# Patient Record
Sex: Male | Born: 2000 | Race: White | Hispanic: No | Marital: Single | Smoking: Current every day smoker
Health system: Southern US, Community
[De-identification: ages and names within clinical notes are randomized; demographics above are authoritative.]

## PROBLEM LIST (undated history)

## (undated) ENCOUNTER — Emergency Department: Admission: EM | Payer: Self-pay | Source: Home / Self Care

## (undated) HISTORY — PX: NO PAST SURGERIES: SHX2092

## (undated) HISTORY — PX: TONSILLECTOMY: SHX5217

---
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
## 2019-06-15 ENCOUNTER — Ambulatory Visit
Admission: EM | Admit: 2019-06-15 | Discharge: 2019-06-15 | Disposition: A | Discharge status: Home / Self Care | Payer: Self-pay | Attending: Family Medicine | Admitting: Family Medicine

## 2019-06-15 ENCOUNTER — Other Ambulatory Visit: Payer: Self-pay

## 2019-06-15 DIAGNOSIS — W57XXXA Bitten or stung by nonvenomous insect and other nonvenomous arthropods, initial encounter: Secondary | ICD-10-CM

## 2019-06-15 DIAGNOSIS — S60467A Insect bite (nonvenomous) of left little finger, initial encounter: Secondary | ICD-10-CM

## 2019-06-15 MED ORDER — TRIAMCINOLONE ACETONIDE 0.5 % EX OINT: 1.0000 "application " | TOPICAL_OINTMENT | Freq: Two times a day (BID) | CUTANEOUS | 0 refills | Status: DC

## 2019-06-15 NOTE — ED Triage Notes (Signed)
Patient complains of insect bite or sting to his left pinky finger. Patient states that he did not see anything but has noticed itching and swelling.

## 2019-06-15 NOTE — Discharge Instructions (Signed)
Medication as prescribed.  Take care  Dr. Remijio Holleran  

## 2019-06-15 NOTE — ED Provider Notes (Signed)
[facility]    CSN: [id number] Arrival date & time: 06/15/19  1323  History   Chief Complaint Chief Complaint  Patient presents with  . Insect Bite   HPI  18 year old male presents with the above complaint.  Patient reports that he believes that he suffered a bite on his left little finger last night.  States that the area is itchy and slightly swollen.  Mild redness.  He did not witness the insect or bite.  He has taken Benadryl and some topical agents old area without resolution.  He has pain.  No fever.  No chills.  No drainage.  No other associated symptoms.  No other complaints.  PMH, Surgical Hx, Family Hx, Social History reviewed and updated as below.  PMH: No significant PMH.  Past Surgical History:  Procedure Laterality Date  . NO PAST SURGERIES     Home Medications    Prior to Admission medications   Medication Sig Start Date End Date Taking? Authorizing Provider  loratadine-pseudoephedrine (CLARITIN-D 24 HOUR) 10-240 MG 24 hr tablet Take 1 tablet by mouth daily. 06/06/17   Frederich Cha, MD  triamcinolone ointment (KENALOG) 0.5 % Apply 1 application topically 2 (two) times daily. 06/15/19   Coral Spikes, DO    Family History Family History  Problem Relation Age of Onset  . Healthy Mother   . Healthy Father     Social History Social History   Tobacco Use  . Smoking status: Never Smoker  . Smokeless tobacco: Never Used  Substance Use Topics  . Alcohol use: No  . Drug use: No     Allergies   Patient has no known allergies.   Review of Systems Review of Systems  Constitutional: Negative.   Skin:       Bug bite.   Physical Exam Triage Vital Signs ED Triage Vitals  Enc Vitals Group     BP 06/15/19 1344 (!) 113/54     Pulse Rate 06/15/19 1344 65     Resp 06/15/19 1344 18     Temp 06/15/19 1344 98.4 F (36.9 C)     Temp Source 06/15/19 1344 Oral     SpO2 06/15/19 1344 100 %     Weight 06/15/19 1342 160 lb (72.6 kg)     Height  06/15/19 1342 5\' 9"  (1.753 m)     Head Circumference --      Peak Flow --      Pain Score 06/15/19 1342 0     Pain Loc --      Pain Edu? --      Excl. in [place]? --    Updated Vital Signs BP (!) 113/54 (BP Location: Left Arm)   Pulse 65   Temp 98.4 F (36.9 C) (Oral)   Resp 18   Ht 5\' 9"  (1.753 m)   Wt 72.6 kg   SpO2 100%   BMI 23.63 kg/m   Visual Acuity Right Eye Distance:   Left Eye Distance:   Bilateral Distance:    Right Eye Near:   Left Eye Near:    Bilateral Near:     Physical Exam Vitals signs and nursing note reviewed.  Constitutional:      General: He is not in acute distress.    Appearance: Normal appearance. He is normal weight. He is not ill-appearing.  HENT:     Head: Normocephalic and atraumatic.  Eyes:     General:        Right eye: No discharge.  Left eye: No discharge.     Conjunctiva/sclera: Conjunctivae normal.  Cardiovascular:     Rate and Rhythm: Normal rate and regular rhythm.     Heart sounds: No murmur.  Pulmonary:     Effort: Pulmonary effort is normal.     Breath sounds: Normal breath sounds. No wheezing, rhonchi or rales.  Musculoskeletal:     Left lower leg: Left lower leg edema:   Skin:    Comments: Left 5th digit - Mild swelling and erythema on the radial aspect below the PIP.  Neurological:     Mental Status: He is alert.  Psychiatric:        Mood and Affect: Mood normal.        Behavior: Behavior normal.    UC Treatments / Results  Labs (all labs ordered are listed, but only abnormal results are displayed) Labs Reviewed - No data to display  EKG   Radiology No results found.  Procedures Procedures (including critical care time)  Medications Ordered in UC Medications - No data to display  Initial Impression / Assessment and Plan / UC Course  I have reviewed the triage vital signs and the nursing notes.  Pertinent labs & imaging results that were available during my care of the patient were reviewed by me  and considered in my medical decision making (see chart for details).    18 year old male presents with suspected insect bite.  Appears to have evidence of a localized reaction. Triamcinolone as directed.  Final Clinical Impressions(s) / UC Diagnoses   Final diagnoses:  Insect bite of left little finger, initial encounter     Discharge Instructions     Medication as prescribed.  Take care  Dr. Adriana Simasook    ED Prescriptions    Medication Sig Dispense Auth. Provider   triamcinolone ointment (KENALOG) 0.5 % Apply 1 application topically 2 (two) times daily. 30 g Tommie Samsook, Cassidy Tabet G, DO     Controlled Substance Prescriptions [place] Controlled Substance Registry consulted? Not Applicable   Tommie SamsCook, Joley Utecht G, DO 06/15/19 1436

## 2019-07-10 ENCOUNTER — Other Ambulatory Visit: Payer: Self-pay

## 2019-07-10 ENCOUNTER — Ambulatory Visit (INDEPENDENT_AMBULATORY_CARE_PROVIDER_SITE_OTHER): Payer: Self-pay

## 2019-07-10 ENCOUNTER — Ambulatory Visit
Admission: EM | Admit: 2019-07-10 | Discharge: 2019-07-10 | Disposition: A | Discharge status: Home / Self Care | Payer: Self-pay | Attending: Family Medicine | Admitting: Family Medicine

## 2019-07-10 ENCOUNTER — Encounter: Payer: Self-pay | Admitting: Emergency Medicine

## 2019-07-10 DIAGNOSIS — J069 Acute upper respiratory infection, unspecified: Secondary | ICD-10-CM

## 2019-07-10 DIAGNOSIS — R062 Wheezing: Secondary | ICD-10-CM

## 2019-07-10 DIAGNOSIS — Z7189 Other specified counseling: Secondary | ICD-10-CM

## 2019-07-10 DIAGNOSIS — Z20828 Contact with and (suspected) exposure to other viral communicable diseases: Secondary | ICD-10-CM

## 2019-07-10 DIAGNOSIS — Z20822 Contact with and (suspected) exposure to covid-19: Secondary | ICD-10-CM

## 2019-07-10 LAB — RAPID STREP SCREEN (MED CTR MEBANE ONLY): Streptococcus, Group A Screen (Direct): NEGATIVE

## 2019-07-10 MED ORDER — ALBUTEROL SULFATE HFA 108 (90 BASE) MCG/ACT IN AERS: 1.0000 | INHALATION_SPRAY | Freq: Four times a day (QID) | RESPIRATORY_TRACT | 0 refills | Status: DC | PRN

## 2019-07-10 MED ORDER — PSEUDOEPH-BROMPHEN-DM 30-2-10 MG/5ML PO SYRP: 5.0000 mL | ORAL_SOLUTION | Freq: Three times a day (TID) | ORAL | 0 refills | Status: DC | PRN

## 2019-07-10 NOTE — Discharge Instructions (Addendum)
It was very nice seeing you today in clinic. Thank you for entrusting me with your care.   Rest and increase fluid intake as much as possible. Water is always best, as sugar and caffeine containing fluids can cause you to become dehydrated. Try to incorporate electrolyte enriched fluids, such as Gatorade or Pedialyte, into your daily fluid intake.  Please utilize the medications that we discussed. Your prescriptions have been called in to your pharmacy. Use medications (inhaler and cough syrup), in addition to Tylenol/Ibuprofen as needed for symptoms.  You were tested for SARS-CoV-2 (novel coronavirus) today. Results have been taking 24-48 hours to come back. Please self quarantine at home until negative test results have been received.   Make arrangements to follow up with your regular doctor in 1 week for re-evaluation if not improving. If your symptoms/condition worsens, please seek follow up care either here or in the ER. Please remember, our [place] providers are "right here with you" when you need [place].   Again, it was my pleasure to take care of you today. Thank you for choosing our clinic. I hope that you start to feel better quickly.   Honor Loh, MSN, APRN, FNP-C, CEN Advanced Practice Provider [facility]

## 2019-07-10 NOTE — ED Triage Notes (Signed)
Patient c/o sore throat, cough, HAs, and sore throat that started yesterday.  Patient has not taken his temperature.

## 2019-07-11 NOTE — ED Provider Notes (Signed)
[place], [place]   Name: Ivan Hicks DOB: Mar 06, 2001 MRN: [id number] CSN: [id number] PCP: Patient, No Pcp Per  Arrival date and time:  07/10/19 1730  Chief Complaint:  Cough, Sore Throat, and Generalized Body Aches   NOTE: Prior to seeing the patient today, I have reviewed the triage nursing documentation and vital signs. Clinical staff has updated patient's PMH/PSHx, current medication list, and drug allergies/intolerances to ensure comprehensive history available to assist in medical decision making.   History:   HPI: Ivan Hicks is a 18 y.o. male who presents today with complaints of cough, sore throat, and body aches that began with acute onset yesterday. Patient reports subjective fevers; has not measured his temperature. Cough has mainly been dry. He denies any associated shortness of breath, but advises that his chest feels "tight". Patient complains of pleuritic chest wall pain associated that is reproducible with deep inspiration and palpation. Patient states, "my whole body hurts. I have been taking Mucinex and Tylenol cold, but it is not helping". Patient denies nausea, vomiting, diarrhea, and abdominal pain. He is eating and drinking well. He has not experienced any alterations in his sense of taste or smell. Patient concerned about possible exposure to SARS-CoV-2 (novel coronavirus) at work. He has never been tested for the virus per his report.   History reviewed. No pertinent past medical history.  Past Surgical History:  Procedure Laterality Date  . NO PAST SURGERIES      Family History  Problem Relation Age of Onset  . Healthy Mother   . Healthy Father     Social History   Tobacco Use  . Smoking status: Never Smoker  . Smokeless tobacco: Never Used  Substance Use Topics  . Alcohol use: No  . Drug use: No    There are no active problems to display for this patient.   Home Medications:    No outpatient medications have been marked as taking for the  07/10/19 encounter [facility] Encounter).    Allergies:   Patient has no known allergies.  Review of Systems (ROS): Review of Systems  Constitutional: Positive for fever (subjective). Negative for fatigue.  HENT: Positive for congestion, postnasal drip, rhinorrhea and sore throat. Negative for ear pain, sinus pressure, sinus pain, sneezing and trouble swallowing.   Eyes: Negative for pain, discharge and redness.  Respiratory: Positive for cough and chest tightness. Negative for shortness of breath.   Cardiovascular: Positive for chest pain (pleuritic). Negative for palpitations.  Gastrointestinal: Negative for abdominal pain, diarrhea, nausea and vomiting.  Musculoskeletal: Positive for myalgias. Negative for arthralgias, back pain and neck pain.  Skin: Negative for color change, pallor and rash.  Neurological: Positive for headaches. Negative for dizziness, syncope and weakness.  Hematological: Negative for adenopathy.     Vital Signs: Today's Vitals   07/10/19 1750 07/10/19 1753 07/10/19 1843  BP:  125/75   Pulse:  77   Resp:  16   Temp:  98.3 F (36.8 C)   TempSrc:  Oral   SpO2:  100%   Weight: 145 lb (65.8 kg)    Height: 5\' 8"  (1.727 m)    PainSc: 4   0-No pain    Physical Exam: Physical Exam  Constitutional: He is oriented to person, place, and time and well-developed, well-nourished, and in no distress. No distress.  HENT:  Head: Normocephalic and atraumatic.  Right Ear: Tympanic membrane normal.  Left Ear: Tympanic membrane normal.  Nose: Mucosal edema and rhinorrhea present. No sinus tenderness.  Mouth/Throat: Mucous membranes are normal. Posterior oropharyngeal erythema (+) clear PND present. No posterior oropharyngeal edema.  Eyes: Pupils are equal, round, and reactive to light. Conjunctivae and EOM are normal.  Neck: Normal range of motion. Neck supple. No tracheal deviation present.  Cardiovascular: Normal rate, regular rhythm, normal heart sounds and intact  distal pulses. Exam reveals no gallop and no friction rub.  No murmur heard. Pulmonary/Chest: Effort normal. No accessory muscle usage. No respiratory distress. He has wheezes (scattered expiratory). He has rhonchi (scattered in upper lobes; clears with cough). He has no rales. He exhibits tenderness.  Increased pleuritic chest wall pain reproducible with deep inspiration  Abdominal: Soft. Bowel sounds are normal. He exhibits no distension. There is no abdominal tenderness.  Musculoskeletal: Normal range of motion.  Lymphadenopathy:    He has no cervical adenopathy.  Neurological: He is alert and oriented to person, place, and time. Gait normal.  Skin: Skin is warm and dry. No rash noted. He is not diaphoretic.  Psychiatric: Memory, affect and judgment normal. His mood appears anxious.  Nursing note and vitals reviewed.   Urgent Care Treatments / Results:   LABS: PLEASE NOTE: all labs that were ordered this encounter are listed, however only abnormal results are displayed. Labs Reviewed  RAPID STREP SCREEN ([facility] ONLY)  CULTURE, GROUP A STREP ([facility])  NOVEL CORONAVIRUS, NAA (HOSP ORDER, SEND-OUT TO REF LAB; TAT 18-24 HRS)    EKG: -None  RADIOLOGY: Dg Chest 2 View  Result Date: 07/10/2019 CLINICAL DATA:  Cough. Dyspnea. Fever. Pleuritic chest pain. Current smoker. EXAM: CHEST - 2 VIEW COMPARISON:  None. FINDINGS: Normal heart size. Normal mediastinal contour. No pneumothorax. No pleural effusion. Lungs appear clear, with no acute consolidative airspace disease and no pulmonary edema. IMPRESSION: No active cardiopulmonary disease. Electronically Signed   By: Delbert PhenixJason A Poff M.D.   On: 07/10/2019 19:02   PROCEDURES: Procedures  MEDICATIONS RECEIVED THIS VISIT: Medications - No data to display  PERTINENT CLINICAL COURSE NOTES/UPDATES:   Initial Impression / Assessment and Plan / Urgent Care Course:  Pertinent labs & imaging results that were available during my care of the  patient were personally reviewed by me and considered in my medical decision making (see lab/imaging section of note for values and interpretations).  Ivan Hicks is a 18 y.o. male who [facility] with complaints of Cough, Sore Throat, and Generalized Body Aches   Patient overall well appearing and in no acute distress today in clinic. Presenting symptoms (see HPI) and exam as documented above. He presents with symptoms associated with SARS-CoV-2 (novel coronavirus). Discussed typical symptom constellation. Reviewed potential for infection and need for testing. Patient amenable to being tested. SARS-CoV-2 swab collected by certified clinical staff. Discussed variable turn around times associated with testing, as sw[facility] being [facility], and have been taking between 2-5 days to come back. He was advised to self quarantine, per [facility] guidelines, until negative results received.   Rapid streptococcal throat swab (-); reflex culture sent. Suspect viral illness with cough. Discussed supportive care measures at home during acute phase of illness. Patient to rest as much as possible. He was encouraged to ensure adequate hydration (water and ORS) to prevent dehydration and electrolyte derangements. Patient may use APAP and/or IBU on an as needed basis for pain/fever. Will send in a prescription for an albuterol MDI to help with wheezing and sensation of chest tightness. Will also send a prescription for [place] cough  syrup to help with patient's cough.   Current clinical condition warrants patient being out of work in order to quarantine while waiting for testing results. He advises that he is off this weekend and does not RTW until Monday. He does not need a note for work. Patient advised that him being able to RTW is contingent on his SARS-CoV-2 test results being reviewed as negative. If results are not back by Monday morning, he will need to remain out of work;  verbalized understanding.   Discussed follow up with primary care physician in 1 week for re-evaluation. I have reviewed the follow up and strict return precautions for any new or worsening symptoms. Patient is aware of symptoms that would be deemed urgent/emergent, and would thus require further evaluation either here or in the emergency department. At the time of discharge, he verbalized understanding and consent with the discharge plan as it was reviewed with him. All questions were fielded by provider and/or clinic staff prior to patient discharge.    Final Clinical Impressions / Urgent Care Diagnoses:   Final diagnoses:  Viral URI with cough  Encounter for laboratory testing for COVID-19 virus  Advice given about COVID-19 virus infection    New Prescriptions:  [place] Controlled Substance Registry consulted? Not Applicable  Meds ordered this encounter  Medications  . brompheniramine-pseudoephedrine-DM 30-2-10 MG/5ML syrup    Sig: Take 5 mLs by mouth 3 (three) times daily as needed.    Dispense:  118 mL    Refill:  0  . albuterol (VENTOLIN HFA) 108 (90 Base) MCG/ACT inhaler    Sig: Inhale 1-2 puffs into the lungs every 6 (six) hours as needed for wheezing or shortness of breath.    Dispense:  18 g    Refill:  0    Recommended Follow up Care:  Patient encouraged to follow up with the following provider within the specified time frame, or sooner as dictated by the severity of his symptoms. As always, he was instructed that for any urgent/emergent care needs, he should seek care either here or in the emergency department for more immediate evaluation.  Follow-up Information    PCP In 1 week.   Why: General reassessment of symptoms if not improving        NOTE: This note was prepared using Scientist, clinical (histocompatibility and immunogenetics) along with smaller Lobbyist. Despite my best ability to proofread, there is the potential that transcriptional errors may still occur from this process, and are  completely unintentional.    [name], NP 07/11/19 2355

## 2019-07-12 LAB — NOVEL CORONAVIRUS, NAA (HOSP ORDER, SEND-OUT TO REF LAB; TAT 18-24 HRS): SARS-CoV-2, NAA: NOT DETECTED

## 2019-07-13 ENCOUNTER — Encounter (HOSPITAL_COMMUNITY): Payer: Self-pay

## 2019-07-13 LAB — CULTURE, GROUP A STREP (THRC)

## 2020-07-18 ENCOUNTER — Other Ambulatory Visit: Payer: Self-pay

## 2020-07-18 ENCOUNTER — Emergency Department
Admission: EM | Admit: 2020-07-18 | Discharge: 2020-07-21 | Disposition: A | Discharge status: Home / Self Care | Payer: Self-pay | Attending: Emergency Medicine | Admitting: Emergency Medicine

## 2020-07-18 DIAGNOSIS — F69 Unspecified disorder of adult personality and behavior: Secondary | ICD-10-CM | POA: Insufficient documentation

## 2020-07-18 DIAGNOSIS — R440 Auditory hallucinations: Secondary | ICD-10-CM | POA: Insufficient documentation

## 2020-07-18 DIAGNOSIS — F17210 Nicotine dependence, cigarettes, uncomplicated: Secondary | ICD-10-CM | POA: Insufficient documentation

## 2020-07-18 DIAGNOSIS — Z20822 Contact with and (suspected) exposure to covid-19: Secondary | ICD-10-CM | POA: Insufficient documentation

## 2020-07-18 LAB — URINE DRUG SCREEN, QUALITATIVE (ARMC ONLY)
Amphetamines, Ur Screen: NOT DETECTED
Barbiturates, Ur Screen: NOT DETECTED
Benzodiazepine, Ur Scrn: NOT DETECTED
Cannabinoid 50 Ng, Ur ~~LOC~~: NOT DETECTED
Cocaine Metabolite,Ur ~~LOC~~: NOT DETECTED
MDMA (Ecstasy)Ur Screen: NOT DETECTED
Methadone Scn, Ur: NOT DETECTED
Opiate, Ur Screen: NOT DETECTED
Phencyclidine (PCP) Ur S: NOT DETECTED
Tricyclic, Ur Screen: NOT DETECTED

## 2020-07-18 LAB — CBC
HCT: 43.5 % (ref 39.0–52.0)
Hemoglobin: 15.4 g/dL (ref 13.0–17.0)
MCH: 33.2 pg (ref 26.0–34.0)
MCHC: 35.4 g/dL (ref 30.0–36.0)
MCV: 93.8 fL (ref 80.0–100.0)
Platelets: 267 10*3/uL (ref 150–400)
RBC: 4.64 MIL/uL (ref 4.22–5.81)
RDW: 12.3 % (ref 11.5–15.5)
WBC: 10.1 10*3/uL (ref 4.0–10.5)
nRBC: 0 % (ref 0.0–0.2)

## 2020-07-18 LAB — COMPREHENSIVE METABOLIC PANEL
ALT: 20 U/L (ref 0–44)
AST: 23 U/L (ref 15–41)
Albumin: 4.9 g/dL (ref 3.5–5.0)
Alkaline Phosphatase: 74 U/L (ref 38–126)
Anion gap: 10 (ref 5–15)
BUN: 12 mg/dL (ref 6–20)
CO2: 26 mmol/L (ref 22–32)
Calcium: 9.5 mg/dL (ref 8.9–10.3)
Chloride: 107 mmol/L (ref 98–111)
Creatinine, Ser: 0.89 mg/dL (ref 0.61–1.24)
GFR, Estimated: >60 mL/min (ref >60)
Glucose, Bld: 96 mg/dL (ref 70–99)
Potassium: 4.3 mmol/L (ref 3.5–5.1)
Sodium: 143 mmol/L (ref 135–145)
Total Bilirubin: 0.6 mg/dL (ref 0.3–1.2)
Total Protein: 7.5 g/dL (ref 6.5–8.1)

## 2020-07-18 LAB — ACETAMINOPHEN LEVEL: Acetaminophen (Tylenol), Serum: <10 ug/mL — ABNORMAL LOW (ref 10–30)

## 2020-07-18 LAB — ETHANOL: Alcohol, Ethyl (B): <10 mg/dL (ref <10)

## 2020-07-18 LAB — SALICYLATE LEVEL: Salicylate Lvl: <7 mg/dL — ABNORMAL LOW (ref 7.0–30.0)

## 2020-07-18 NOTE — ED Triage Notes (Signed)
PT to ED in custody of [place] [employer] for IVC. When asked why here pt states "probably because of dumbass parents". IVC paperwork states pt is dangerous to self, siting pt talking to voices and not sleeping or bathing.

## 2020-07-18 NOTE — ED Notes (Signed)
Patient transferred from Triage to room 21 after dressing out and screening for contraband. Report received from [place], [place] including situation, background, assessment and recommendations. Pt oriented to [employer] including Q15 minute rounds as well as Psychologist, counselling for their protection. Patient is alert and oriented, warm and dry in no acute distress. Patient denies SI, HI, and AVH. Pt. Encouraged to let this nurse know if needs arise.

## 2020-07-18 NOTE — ED Notes (Signed)
Pt changed into hospital scrubs Belongings confiscated include:  Blue fallenl Black tshirt  Black pants  brown boots  Black socks 1 lip ring
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
## 2020-07-19 LAB — RESPIRATORY PANEL BY RT PCR (FLU A&B, COVID)
Influenza A by PCR: NEGATIVE
Influenza B by PCR: NEGATIVE
SARS Coronavirus 2 by RT PCR: NEGATIVE

## 2020-07-19 MED ORDER — NICOTINE 21 MG/24HR TD PT24
21.0000 mg | MEDICATED_PATCH | Freq: Once | TRANSDERMAL | Status: AC
  Administered 2020-07-19: 21 mg via TRANSDERMAL
  Filled 2020-07-19: qty 1

## 2020-07-19 MED ORDER — TRIHEXYPHENIDYL HCL 2 MG PO TABS
1.0000 mg | ORAL_TABLET | Freq: Two times a day (BID) | ORAL | Status: DC
  Administered 2020-07-21: 1 mg via ORAL
  Filled 2020-07-19 (×7): qty 1

## 2020-07-19 MED ORDER — HALOPERIDOL 0.5 MG PO TABS
2.0000 mg | ORAL_TABLET | Freq: Two times a day (BID) | ORAL | Status: DC
  Administered 2020-07-19 – 2020-07-21 (×3): 2 mg via ORAL
  Filled 2020-07-19 (×4): qty 4

## 2020-07-19 MED ORDER — DULOXETINE HCL 20 MG PO CPEP
20.0000 mg | ORAL_CAPSULE | Freq: Every day | ORAL | Status: DC
  Administered 2020-07-21: 20 mg via ORAL
  Filled 2020-07-19 (×4): qty 1

## 2020-07-19 MED ORDER — CLONAZEPAM 1 MG PO TABS
1.0000 mg | ORAL_TABLET | Freq: Two times a day (BID) | ORAL | Status: DC
  Administered 2020-07-19 – 2020-07-21 (×3): 1 mg via ORAL
  Filled 2020-07-19 (×4): qty 1

## 2020-07-19 NOTE — BH Assessment (Signed)
Assessment Note  Ivan Hicks is a 19 y.o. male who presents to [facility] ED involuntarily for treatment. Per triage note, PT to ED in custody of [place] Police for IVC. When asked why here pt states "probably because of dumbass parents". IVC paperwork states pt is dangerous to self, siting pt talking to voices and not sleeping or bathing.  During TTS assessment pt presents alert and oriented x 3, tangential, elated, rapid speech, some inappropriate laughter but cooperative, and mood-congruent with affect. The pt does not appear to be responding to internal or external stimuli. Neither is the pt presenting with any delusional thinking. Pt denies the information provided to triage RN. Pt confirmed making the statement about his parents and having some troubles sleeping but denies being a danger to himself or endorsing AH. Pt identified his main complaint to be anxiety. Pt denies any depression symptoms and is currently unable to identify triggers to his anxiety. Pt denies taking any medications but states "if anything I could some Xanax for an anxiety".  Pt denies a hx of SA but reports drinking "some wine occasionally". TTS attempted to inquire more about his alcohol consumption and Pt abruptly began making statements pertaining to BPD referring to him as the devil, no one being normal, his dislike for dad and lack of trust in his parents in general. Pt reports isolating himself to his room to avoid conflict with family. Pt states "I was sleeping and woke up to the police handcuffing me".  In attempt to explore pt family hx of MH/SA, Pt laughed stating "everyone in my family is addicted to something and crazy". Pt had a hard time refocusing and completing the remainder of the assessment. Pt denies a MH and INPT hx. Pt reports an OPT hx when he was a teenager but is currently unable to provide any further information. Pt confirmed graduating high school and to currently live with his mom. Pt states "I'm ok  returning home if she leaves me alone". Pt denies any current SI/HI/AH/VH and provided permission for his parents to be contacted as needed. Pt states "like I said don't trust them to much I'll prove my case while I'm here".   Per Dr. Smith Robert pt meets criteria for INPT   Diagnosis: MDD with psychosis   Past Medical History: History reviewed. No pertinent past medical history.  Past Surgical History:  Procedure Laterality Date  . NO PAST SURGERIES      Family History:  Family History  Problem Relation Age of Onset  . Healthy Mother   . Healthy Father     Social History:  reports that he has been smoking. He has been smoking about 0.33 packs per day. He has never used smokeless tobacco. He reports that he does not drink alcohol and does not use drugs.  Additional Social History:  Alcohol / Drug Use Pain Medications: see mar Prescriptions: see mar Over the Counter: see mar History of alcohol / drug use?: Yes Substance #1 Name of Substance 1: Pt reports drinking wine occasionally  CIWA: CIWA-Ar BP: 140/81 Pulse Rate: 74 COWS:    Allergies: No Known Allergies  Home Medications: (Not in a hospital admission)   OB/GYN Status:  No LMP for male patient.  General Assessment Data Location of Assessment: [facility] ED TTS Assessment: In system Is this a Tele or Face-to-Face Assessment?: Face-to-Face Is this an Initial Assessment or a Re-assessment for this encounter?: Initial Assessment Patient Accompanied by:: N/A Language Other than English: No Living Arrangements:  Other (Comment) What gender do you identify as?: Male Date Telepsych consult ordered in [facility]: 07/18/20 Time Telepsych consult ordered in [facility]: 2110 Marital status: Single Maiden name: n/a Pregnancy Status: No Living Arrangements: Parent Can pt return to current living arrangement?: Yes Admission Status: Involuntary Petitioner: Family member Is patient capable of signing voluntary admission?: No Referral Source:  Other Insurance type: None      Crisis Care Plan Living Arrangements: Parent Legal Guardian:  (self) Name of Psychiatrist: None reported  Name of Therapist: None reported   Education Status Is patient currently in school?: No Is the patient employed, unemployed or receiving disability?: Unemployed  Risk to self with the past 6 months Suicidal Ideation: No Has patient been a risk to self within the past 6 months prior to admission? : No Suicidal Intent: No Has patient had any suicidal intent within the past 6 months prior to admission? : No Is patient at risk for suicide?: No Suicidal Plan?: No Has patient had any suicidal plan within the past 6 months prior to admission? : No Access to Means: No What has been your use of drugs/alcohol within the last 12 months?: Pt reports drinking wine occasionally  Previous Attempts/Gestures: No How many times?: 0 Other Self Harm Risks: None reported  Triggers for Past Attempts: None known Intentional Self Injurious Behavior: None Family Suicide History: No Recent stressful life event(s): Conflict (Comment) (family ) Persecutory voices/beliefs?: No Depression: No Substance abuse history and/or treatment for substance abuse?: No Suicide prevention information given to non-admitted patients: Not applicable  Risk to Others within the past 6 months Homicidal Ideation: No Does patient have any lifetime risk of violence toward others beyond the six months prior to admission? : No Thoughts of Harm to Others: No Current Homicidal Intent: No Current Homicidal Plan: No Access to Homicidal Means: No Identified Victim: n/a History of harm to others?: No Assessment of Violence: None Noted Does patient have access to weapons?: No Criminal Charges Pending?: No Does patient have a court date: No Is patient on probation?: No  Psychosis Hallucinations:  (Per IVC pt talking to voices, pt denies ) Delusions: None noted  Mental Status  Report Appearance/Hygiene: In scrubs Eye Contact: Good Motor Activity: Freedom of movement Speech: Logical/coherent, Rapid, Tangential Level of Consciousness: Alert Mood: Anxious, Elated, Pleasant Affect: Anxious Anxiety Level: Moderate Thought Processes: Coherent, Relevant Judgement: Unimpaired Orientation: Appropriate for developmental age Obsessive Compulsive Thoughts/Behaviors: Minimal (pt states "i'm not crazy I can do handle this on my own")  Cognitive Functioning Concentration: Normal Memory: Recent Intact, Remote Intact Is patient IDD: No Insight: Poor Impulse Control: Poor Appetite: Good Have you had any weight changes? : No Change Sleep: No Change Total Hours of Sleep: 8 Vegetative Symptoms: None  ADLScreening [facility] Assessment Services) Patient's cognitive ability adequate to safely complete daily activities?: Yes Patient able to express need for assistance with ADLs?: Yes Independently performs ADLs?: Yes (appropriate for developmental age)  Prior Inpatient Therapy Prior Inpatient Therapy: No  Prior Outpatient Therapy Prior Outpatient Therapy: No Does patient have an ACCT team?: No Does patient have Intensive In-House Services?  : No Does patient have [facility] services? : No Does patient have P4CC services?: No  ADL Screening (condition at time of admission) Patient's cognitive ability adequate to safely complete daily activities?: Yes Is the patient deaf or have difficulty hearing?: No Does the patient have difficulty seeing, even when wearing glasses/contacts?: No Does the patient have difficulty concentrating, remembering, or making decisions?: No Patient able to  express need for assistance with ADLs?: Yes Does the patient have difficulty dressing or bathing?: No Independently performs ADLs?: Yes (appropriate for developmental age) Does the patient have difficulty walking or climbing stairs?: No Weakness of Legs: None Weakness of Arms/Hands: None  Home  Assistive Devices/Equipment Home Assistive Devices/Equipment: None  Therapy Consults (therapy consults require a physician order) PT Evaluation Needed: No OT Evalulation Needed: No SLP Evaluation Needed: No Abuse/Neglect Assessment (Assessment to be complete while patient is alone) Abuse/Neglect Assessment Can Be Completed: Yes Physical Abuse: Denies Verbal Abuse: Denies Sexual Abuse: Denies Exploitation of patient/patient's resources: Denies Self-Neglect: Denies Values / Beliefs Cultural Requests During Hospitalization: None Spiritual Requests During Hospitalization: None Consults Spiritual Care Consult Needed: No Transition of Care Team Consult Needed: No Advance Directives (For Healthcare) Does Patient Have a Medical Advance Directive?: No Would patient like information on creating a medical advance directive?: No - Patient declined          Disposition:  Disposition Initial Assessment Completed for this Encounter: Yes Patient referred to: Other (Comment)  On Site Evaluation by:   Reviewed with Physician:    Opal Sidles 07/19/2020 5:10 PM

## 2020-07-19 NOTE — ED Notes (Signed)
Hourly rounding completed at this time, patient currently asleep in room. No complaints, stable, and in no acute distress. Q15 minute rounds and monitoring via Rover and Officer to continue. 

## 2020-07-19 NOTE — ED Notes (Signed)
Hourly rounding completed at this time, patient currently awake in room. No complaints, stable, and in no acute distress. Q15 minute rounds and monitoring via Security Cameras to continue. 

## 2020-07-19 NOTE — ED Notes (Signed)
Hourly rounding completed at this time, patient currently asleep in room. No complaints, stable, and in no acute distress. Q15 minute rounds and monitoring via Security Cameras to continue. 

## 2020-07-19 NOTE — ED Notes (Signed)
Pt received nighttime snacks and is now resting and calm in bed.   lw edt

## 2020-07-19 NOTE — BH Assessment (Signed)
Referral information for Psychiatric Hospitalization faxed to:   [facility] ([phone]),    [address] [phone]),    [facility] [phone] -or- [phone]),    [facility] [phone] -[phone])              [phone]   [facility] [phone]),    [facility] [phone] or [phone])   [facility] [phone] or [phone])   [facility] [phone] or [phone]),

## 2020-07-19 NOTE — ED Notes (Signed)
Report received from Amy, RN including  Situation, Background, Assessment, and Recommendations. Patient alert and oriented, warm and dry, in no acute distress. Patient denies SI, HI, AVH and pain. Patient made aware of Q15 minute rounds and security cameras for their safety. Patient instructed to come to this nurse with needs or concerns. 

## 2020-07-19 NOTE — ED Notes (Signed)
Pt requests update on plan of care, provided by this nurse. Pt expresses understanding and appreciation for update

## 2020-07-19 NOTE — ED Notes (Signed)
Pt refused medications. Pt is angry he is here. "I'm more mentally stable than my parents. My dad is an alcoholic and my mom is on all sorts of pill bottles."  Pt voiced he does not feel anyone is listening to him and only listening to his parents.  Pt is calm with staff.

## 2020-07-19 NOTE — Consult Note (Signed)
[facility] Face-to-Face Psychiatry Consult   [facility]:    Worsening major depression with psychosis    No previous treatment     Referring Physician:   ED MD    Patient Identification: Ivan Hicks Binette MRN:  [id number] Principal Diagnosis: <principal problem not specified> Diagnosis:  Active Problems:   * No active hospital problems. *  Major depression severe recurrent with psychosis   Vs. Schizoaffective disorder    Total Time spent with patient:   30-40 hrs  Collateral from Parents    Subjective:   Ivan Hicks Kamaka is a 19 y.o. male patient admitted with  Worsening severe psychosis     Previous depression  Parents cannot handle or manage his deteriorating state   HPI:  Parents have been worried over  many months.  He has a history of major depression but now is in severe psychotic state   He is internally distracted, hears voices, is paranoid fearful suspicious.  --has conversations out loud with no one in room   Parents have found butcher knife in living room   "he wants to go to the astral realm he says "----  His ADL'Hicks are worsening no shower or hygiene for weeks ----isolates Self --- He is having strange and erratic expressions -behaviors --and has been up for many days without sleep    He is has worsening IED screaming episodes --loud curse words --Has worsening illogical thought --disorganized thinking as well  Parents did IVC due to risk of severe clinical deterioration ---and unclear safety margin   Patient is not that cooperative and has poor insight      Past Psychiatric History:  No previous follow up   Risk to Self:  moderate to high  Risk to Others:  moderate to high  Prior Inpatient Therapy:  none  Prior Outpatient Therapy:  none   Past Medical History: History reviewed. No pertinent past medical history.  Past Surgical History:  Procedure Laterality Date  . NO PAST SURGERIES     Family History:  Family History  Problem Relation  Age of Onset  . Healthy Mother   . Healthy Father    Family Psychiatric  History:  Dad with anxiety     Many paternal relatives with bipolar with psychosis and schizophrenia    Social History:  Stays in room in [place]veled state over some months  Very dysfunctional refuses treatment and meds  He does not feel his mentally ill at all    Court and legal issues none  Substance drug and etoh issues none   Developmental delays not reported NO LD'Hicks but has ADHD history   No previous treatment hospitalizations  Depressed over many years but now in full psychosis over several months       Social History   Substance and Sexual Activity  Alcohol Use No     Social History   Substance and Sexual Activity  Drug Use No    Social History   Socioeconomic History  . Marital status: Single    Spouse name: Not on file  . Number of children: Not on file  . Years of education: Not on file  . Highest education level: Not on file  Occupational History  . Not on file  Tobacco Use  . Smoking status: Current Every Day Smoker    Packs/day: 0.33  . Smokeless tobacco: Never Used  Vaping Use  . Vaping Use: Every day  Substance and Sexual Activity  . Alcohol use: No  .  Drug use: No  . Sexual activity: Not on file  Other Topics Concern  . Not on file  Social History Narrative  . Not on file   Social Determinants of Health   Financial Resource Strain:   . Difficulty of Paying Living Expenses: Not on file  Food Insecurity:   . Worried About Programme researcher, broadcasting/film/video in the Last Year: Not on file  . Ran Out of Food in the Last Year: Not on file  Transportation Needs:   . Lack of Transportation (Medical): Not on file  . Lack of Transportation (Non-Medical): Not on file  Physical Activity:   . Days of Exercise per Week: Not on file  . Minutes of Exercise per Session: Not on file  Stress:   . Feeling of Stress : Not on file  Social Connections:   . Frequency of Communication with  Friends and Family: Not on file  . Frequency of Social Gatherings with Friends and Family: Not on file  . Attends Religious Services: Not on file  . Active Member of Clubs or Organizations: Not on file  . Attends Banker Meetings: Not on file  . Marital Status: Not on file   Additional Social History:    Not working ---his room is filthy and he is hoarding ---   Allergies:  No Known Allergies  Labs:  Results for orders placed or performed during the hospital encounter of 07/18/20 (from the past 48 hour(Hicks))  Comprehensive metabolic panel     Status: None   Collection Time: 07/18/20  9:23 PM  Result Value Ref Range   Sodium 143 135 - 145 mmol/L   Potassium 4.3 3.5 - 5.1 mmol/L   Chloride 107 98 - 111 mmol/L   CO2 26 22 - 32 mmol/L   Glucose, Bld 96 70 - 99 mg/dL    Comment: Glucose reference range applies only to samples taken after fasting for at least 8 hours.   BUN 12 6 - 20 mg/dL   Creatinine, Ser 4.78 0.61 - 1.24 mg/dL   Calcium 9.5 8.9 - 29.5 mg/dL   Total Protein 7.5 6.5 - 8.1 g/dL   Albumin 4.9 3.5 - 5.0 g/dL   AST 23 15 - 41 U/L   ALT 20 0 - 44 U/L   Alkaline Phosphatase 74 38 - 126 U/L   Total Bilirubin 0.6 0.3 - 1.2 mg/dL   GFR, Estimated >62 >13 mL/min   Anion gap 10 5 - 15    Comment: Performed at [facility], [address] [place], [place] [address]  Ethanol     Status: None   Collection Time: 07/18/20  9:23 PM  Result Value Ref Range   Alcohol, Ethyl (B) <10 <10 mg/dL    Comment: (NOTE) Lowest detectable limit for serum alcohol is 10 mg/dL.  For medical purposes only. Performed at [facility], [address] [place], [place] [address]   Salicylate level     Status: Abnormal   Collection Time: 07/18/20  9:23 PM  Result Value Ref Range   Salicylate Lvl <7.0 (L) 7.0 - 30.0 mg/dL    Comment: Performed at [facility], [address] [place], [place] [address]  Acetaminophen level     Status: Abnormal    Collection Time: 07/18/20  9:23 PM  Result Value Ref Range   Acetaminophen (Tylenol), Serum <10 (L) 10 - 30 ug/mL    Comment: (NOTE) Therapeutic concentrations vary significantly. A range of 10-30 ug/mL  may be an effective concentration for many patients. However, some  are best treated at concentrations outside of this range. Acetaminophen concentrations >150 ug/mL at 4 hours after ingestion  and >50 ug/mL at 12 hours after ingestion are often associated with  toxic reactions.  Performed at [facility], [address] [place], [place] [address]   cbc     Status: None   Collection Time: 07/18/20  9:23 PM  Result Value Ref Range   WBC 10.1 4.0 - 10.5 K/uL   RBC 4.64 4.22 - 5.81 MIL/uL   Hemoglobin 15.4 13.0 - 17.0 g/dL   HCT 48.5 39 - 52 %   MCV 93.8 80.0 - 100.0 fL   MCH 33.2 26.0 - 34.0 pg   MCHC 35.4 30.0 - 36.0 g/dL   RDW 46.2 70.3 - 50.0 %   Platelets 267 150 - 400 K/uL   nRBC 0.0 0.0 - 0.2 %    Comment: Performed at [facility], [address] [place], [place] [address]  Urine Drug Screen, Qualitative     Status: None   Collection Time: 07/18/20  9:23 PM  Result Value Ref Range   Tricyclic, Ur Screen NONE DETECTED NONE DETECTED   Amphetamines, Ur Screen NONE DETECTED NONE DETECTED   MDMA (Ecstasy)Ur Screen NONE DETECTED NONE DETECTED   Cocaine Metabolite,Ur [place] NONE DETECTED NONE DETECTED   Opiate, Ur Screen NONE DETECTED NONE DETECTED   Phencyclidine (PCP) Ur [place] NONE DETECTED NONE DETECTED   Cannabinoid 50 Ng, Ur  NONE DETECTED NONE DETECTED   Barbiturates, Ur Screen NONE DETECTED NONE DETECTED   Benzodiazepine, Ur Scrn NONE DETECTED NONE DETECTED   Methadone Scn, Ur NONE DETECTED NONE DETECTED    Comment: (NOTE) Tricyclics + metabolites, urine    Cutoff 1000 ng/mL Amphetamines + metabolites, urine  Cutoff 1000 ng/mL MDMA (Ecstasy), urine              Cutoff 500 ng/mL Cocaine Metabolite, urine          Cutoff 300 ng/mL Opiate + metabolites,  urine        Cutoff 300 ng/mL Phencyclidine (PCP), urine         Cutoff 25 ng/mL Cannabinoid, urine                 Cutoff 50 ng/mL Barbiturates + metabolites, urine  Cutoff 200 ng/mL Benzodiazepine, urine              Cutoff 200 ng/mL Methadone, urine                   Cutoff 300 ng/mL  The urine drug screen provides only a preliminary, unconfirmed analytical test result and should not be used for non-medical purposes. Clinical consideration and professional judgment should be applied to any positive drug screen result due to possible interfering substances. A more specific alternate chemical method must be used in order to obtain a confirmed analytical result. Gas chromatography / mass spectrometry (GC/MS) is the preferred confirm atory method. Performed at [facility], [address] [place], [place] [address]   Respiratory Panel by RT PCR (Flu A&B, Covid) - Nasopharyngeal Swab     Status: None   Collection Time: 07/19/20 12:47 AM   Specimen: Nasopharyngeal Swab  Result Value Ref Range   SARS Coronavirus 2 by RT PCR NEGATIVE NEGATIVE    Comment: (NOTE) SARS-CoV-2 target nucleic acids are NOT DETECTED.  The SARS-CoV-2 RNA is generally detectable in upper respiratoy specimens during  the acute phase of infection. The lowest concentration of SARS-CoV-2 viral copies this assay can detect is 131 copies/mL. A negative result does not preclude SARS-Cov-2 infection and should not be used as the sole basis for treatment or other patient management decisions. A negative result may occur with  improper specimen collection/handling, submission of specimen other than nasopharyngeal swab, presence of viral mutation(Hicks) within the areas targeted by this assay, and inadequate number of viral copies (<131 copies/mL). A negative result must be combined with clinical observations, patient history, and epidemiological information. The expected result is Negative.  Fact Sheet for  Patients:  https://www.moore.com/  Fact Sheet for Healthcare Providers:  https://www.young.biz/  This test is no t yet approved or cleared by the [place] FDA and  has been authorized for detection and/or diagnosis of SARS-CoV-2 by FDA under an Emergency Use Authorization (EUA). This EUA will remain  in effect (meaning this test can be used) for the duration of the COVID-19 declaration under Section 564(b)(1) of the Act, 21 [place]C. section 360bbb-3(b)(1), unless the authorization is terminated or revoked sooner.     Influenza A by PCR NEGATIVE NEGATIVE   Influenza B by PCR NEGATIVE NEGATIVE    Comment: (NOTE) The Xpert Xpress SARS-CoV-2/FLU/RSV assay is intended as an aid in  the diagnosis of influenza from Nasopharyngeal swab specimens and  should not be used as a sole basis for treatment. Nasal washings and  aspirates are unacceptable for Xpert Xpress SARS-CoV-2/FLU/RSV  testing.  Fact Sheet for Patients: https://www.moore.com/  Fact Sheet for Healthcare Providers: https://www.young.biz/  This test is not yet approved or cleared by the [place] FDA and  has been authorized for detection and/or diagnosis of SARS-CoV-2 by  FDA under an Emergency Use Authorization (EUA). This EUA will remain  in effect (meaning this test can be used) for the duration of the  Covid-19 declaration under Section 564(b)(1) of the Act, 21  U.[place].C. section 360bbb-3(b)(1), unless the authorization is  terminated or revoked. Performed at [facility], [address], [place], [place] [address]     Current Facility-Administered Medications  Medication Dose Route Frequency Provider Last Rate Last Admin  . nicotine (NICODERM CQ - dosed in mg/24 hours) patch 21 mg  21 mg Transdermal Once Gilles Chiquito, MD   21 mg at 07/19/20 0981   Current Outpatient Medications  Medication Sig Dispense Refill  .  albuterol (VENTOLIN HFA) 108 (90 Base) MCG/ACT inhaler Inhale 1-2 puffs into the lungs every 6 (six) hours as needed for wheezing or shortness of breath. (Patient not taking: Reported on 07/19/2020) 18 g 0  . brompheniramine-pseudoephedrine-DM 30-2-10 MG/5ML syrup Take 5 mLs by mouth 3 (three) times daily as needed. (Patient not taking: Reported on 07/19/2020) 118 mL 0    Musculoskeletal: Strength & Muscle Tone: normal  Gait & Station:  No new change  Patient leans: none   Psychiatric Specialty Exam: Physical Exam  Review of Systems  Blood pressure (!) 135/93, pulse 68, temperature 98.1 F (36.7 C), temperature source Oral, resp. rate (!) 21, height  (1.753 m), weight 72.6 kg, SpO2 100 %.Body mass index is 23.63 kg/m.    Mental Status    Exam limited  Disheveled state Looks unkept haggard forlorn messy Eye contact and rapport poor Mood and affect depressed and anxious Poor judgement insight reliability does not feel he is ill  In denial of all refuses meds No shakes tics tremors Thought process and content --filled with delusions, illogical thought -  Paranoid fearful suspicious Intelligence and fund of knowledge normal  No active SI or hi  But high risk of clinical deterioration if discharged  Memory cannot assess Concentration and attention fair  Consciousness not clouded or fluctuant   Speech normal   Abstraction poor                                                               Recall poor Psychomotor variable Akathisia none Language normal Cognition impaired ADL'Hicks very poor Assets caring family  Handedness none       Treatment Plan Summary:  Patient with major depression severe recurrent ---along with now severe psychosis   Awaits psych bed transfer on IVC  New meds initially started but he refuses  Will most likely need court ordered meds once transferred   Parents were notified and conferenced  In     No  other previous treatment     Disposition:    Awaits psych bed transfer   Roselind Messier, MD 07/19/2020 1:36 PM

## 2020-07-20 NOTE — ED Notes (Signed)
Hourly rounding reveals patient in room. No complaints, stable, in no acute distress. Q15 minute rounds and monitoring via Security Cameras to continue. 

## 2020-07-20 NOTE — ED Notes (Signed)
Hourly rounding completed at this time, patient currently asleep in room. No complaints, stable, and in no acute distress. Q15 minute rounds and monitoring via Security Cameras to continue. 

## 2020-07-20 NOTE — BH Assessment (Deleted)
PATIENT BED AVAILABLE AFTER 9AM  Patient has been accepted to [facility].  Patient assigned to [facility] C-Unit Accepting physician is Dr. Sallyanne Kuster.  Call report to [phone] or [phone]  Representative was [place].   ER Staff is aware of it:  [facility] ER Secretary  Dr. Manson Passey, ER MD  Thayer Ohm Patient's Nurse     Address: [address] [place], [address] Patient must check in at the [facility]

## 2020-07-20 NOTE — ED Notes (Signed)
Hourly rounding completed at this time, patient currently awake in restroom. No complaints, stable, and in no acute distress. Q15 minute rounds and monitoring via Security Cameras to continue. 

## 2020-07-20 NOTE — ED Notes (Signed)
Report to include Situation, Background, Assessment, and Recommendations received from Amy RN. Patient alert and oriented, warm and dry, in no acute distress. Patient denies SI, HI, AVH and pain. Patient made aware of Q15 minute rounds and security cameras for their safety. Patient instructed to come to me with needs or concerns.  

## 2020-07-20 NOTE — ED Provider Notes (Signed)
Emergency Medicine Observation Re-evaluation Note  Ivan Hicks is a 19 y.o. male, seen on rounds today.  Pt initially presented to the ED for complaints of IVC Currently, the patient is resting calmly.  Physical Exam  BP (!) 119/93   Pulse 90   Temp 98.1 F (36.7 C) (Oral)   Resp 18   Ht 5\' 9"  (1.753 m)   Wt 72.6 kg   SpO2 99%   BMI 23.63 kg/m  Physical Exam Vitals and nursing note reviewed.  HENT:     Head: Normocephalic and atraumatic.     Right Ear: External ear normal.     Left Ear: External ear normal.     Nose: Nose normal.  Pulmonary:     Effort: No respiratory distress.  Abdominal:     General: There is no distension.  Neurological:     Mental Status: He is alert.  Psychiatric:        Mood and Affect: Mood normal.      ED Course / MDM  EKG:EKG Interpretation  Date/Time:  Tuesday July 19 2020 01:32:32 EDT Ventricular Rate:  61 PR Interval:  150 QRS Duration: 90 QT Interval:  432 QTC Calculation: 434 R Axis:   81 Text Interpretation: Normal sinus rhythm Normal ECG Confirmed by UNCONFIRMED, DOCTOR (03-08-2007), editor [id number], Tammy [phone]) on 07/19/2020 10:35:55 AM  Clinical Course as of Jul 20 1529  Tue Jul 19, 2020  0020 Called father, 0021, twice - call could not be completed   [DS]  0021 Called mother, [name],  "of course he wouldn't tell you..." Full conversations in his own head Violent, threatening his parents, frequent cursing.  Doesn't sleep for days Won't bathe.  Snapping fingers and pacing constantly, talking to himself.  Dad on the phone now.  Reinforces that he's smart, manipulative "His room makes a hoarder look good"   [DS]  0028 He does smoke cigarettes, but no concerns about drug use.   [DS]  0028 Very verbally abusive. Frequently hysterically laughing, then switch flips to yelling and cussing at no one. Sometimes pretends to be on the phone, but mom then talks to friend and confirms patient never was on the phone.   Mom  locks her own door in their home.  Butcher knife on the floor on Mom's room today. She's concerned that she accidentally pissed him off too much. She's concerned about her own safety, which is why she called [phone].    [DS]  0039 The patient has been placed in psychiatric observation due to the need to provide a safe environment for the patient while obtaining psychiatric consultation and evaluation, as well as ongoing medical and medication management to treat the patient's condition. The patient has been placed under full IVC at this time.     [DS]    Clinical Course User Index [DS] 0040, MD   I have reviewed the labs performed to date as well as medications administered while in observation.  Recent changes in the last 24 hours include some bizarre and aggressive behavior as documented in nursing notes including yelling at anyone who approaches, flips lights on and off. No sedation medication ordered by myself on my shift.   Plan  Current plan is for awaiting psych bed placement. Patient is under full IVC at this time.   Delton Prairie, MD 07/20/20 1531

## 2020-07-20 NOTE — BH Assessment (Signed)
Referral checks:  Ivan Hicks ([phone]- [phone]), Denied due to no insurance  [address] [phone]), Denied due to refusal to take medication  [name] [phone] -[phone]), Denied due to history of aggression  [facility] [phone] -or- [phone]), Not in the age range. minors and geriatric patients.  Ivan Hicks ([phone]---[phone]---[phone]), Asked to resend information.  [place] [phone] or [phone]), left a voicemail message  [facility] [phone] or [phone]), Not in the age range, minors and geriatric patients.  Ivan Hicks [phone] or [phone]), Not in a the age range, geriatric only

## 2020-07-20 NOTE — ED Notes (Signed)
He is currently talking on the phone with his father

## 2020-07-20 NOTE — BH Assessment (Addendum)
Referral checks:             [facility] ([phone]- [phone]), Denied due to no insurance             [facility] [phone]), Denied due to refusal to take medication             [facility] [phone] -[phone]), Denied due to history of aggression             [facility] [phone])              [phone]             [facility] Pl[facility] [phone]),              [facility] [phone] or [phone])             [facility] [phone] or [phone])             [facility] [phone] or [phone]),

## 2020-07-20 NOTE — ED Notes (Signed)
He is currently talking to his mother on the phone

## 2020-07-21 ENCOUNTER — Encounter (HOSPITAL_COMMUNITY): Payer: Self-pay | Admitting: Psychiatry

## 2020-07-21 ENCOUNTER — Other Ambulatory Visit: Payer: Self-pay

## 2020-07-21 ENCOUNTER — Inpatient Hospital Stay (HOSPITAL_COMMUNITY)
Admission: AD | Admit: 2020-07-21 | Discharge: 2020-07-27 | DRG: 885 | Disposition: A | Discharge status: Home / Self Care | Payer: Federal, State, Local not specified - Other | Attending: Psychiatry | Admitting: Psychiatry

## 2020-07-21 DIAGNOSIS — Z62810 Personal history of physical and sexual abuse in childhood: Secondary | ICD-10-CM | POA: Diagnosis present

## 2020-07-21 DIAGNOSIS — F329 Major depressive disorder, single episode, unspecified: Secondary | ICD-10-CM | POA: Diagnosis present

## 2020-07-21 DIAGNOSIS — T450X4D Poisoning by antiallergic and antiemetic drugs, undetermined, subsequent encounter: Secondary | ICD-10-CM

## 2020-07-21 DIAGNOSIS — Z23 Encounter for immunization: Secondary | ICD-10-CM | POA: Diagnosis not present

## 2020-07-21 DIAGNOSIS — Z818 Family history of other mental and behavioral disorders: Secondary | ICD-10-CM

## 2020-07-21 DIAGNOSIS — F23 Brief psychotic disorder: Principal | ICD-10-CM | POA: Diagnosis present

## 2020-07-21 DIAGNOSIS — T7422XA Child sexual abuse, confirmed, initial encounter: Secondary | ICD-10-CM | POA: Diagnosis present

## 2020-07-21 DIAGNOSIS — F323 Major depressive disorder, single episode, severe with psychotic features: Secondary | ICD-10-CM | POA: Diagnosis not present

## 2020-07-21 DIAGNOSIS — F17210 Nicotine dependence, cigarettes, uncomplicated: Secondary | ICD-10-CM | POA: Diagnosis present

## 2020-07-21 DIAGNOSIS — F322 Major depressive disorder, single episode, severe without psychotic features: Secondary | ICD-10-CM | POA: Diagnosis not present

## 2020-07-21 MED ORDER — INFLUENZA VAC SPLIT QUAD 0.5 ML IM SUSY
0.5000 mL | PREFILLED_SYRINGE | INTRAMUSCULAR | Status: AC
  Administered 2020-07-22: 0.5 mL via INTRAMUSCULAR
  Filled 2020-07-21: qty 0.5

## 2020-07-21 MED ORDER — TRIHEXYPHENIDYL HCL 2 MG PO TABS
1.0000 mg | ORAL_TABLET | Freq: Two times a day (BID) | ORAL | Status: DC
  Administered 2020-07-22: 1 mg via ORAL
  Filled 2020-07-21 (×5): qty 1

## 2020-07-21 MED ORDER — NICOTINE POLACRILEX 2 MG MT GUM
2.0000 mg | CHEWING_GUM | OROMUCOSAL | Status: DC | PRN
  Administered 2020-07-22 – 2020-07-26 (×2): 2 mg via ORAL
  Filled 2020-07-21 (×2): qty 1

## 2020-07-21 MED ORDER — HALOPERIDOL 2 MG PO TABS
2.0000 mg | ORAL_TABLET | Freq: Two times a day (BID) | ORAL | Status: DC
  Administered 2020-07-21 – 2020-07-22 (×2): 2 mg via ORAL
  Filled 2020-07-21 (×7): qty 1

## 2020-07-21 MED ORDER — DULOXETINE HCL 20 MG PO CPEP
20.0000 mg | ORAL_CAPSULE | Freq: Every day | ORAL | Status: DC
  Administered 2020-07-22: 20 mg via ORAL
  Filled 2020-07-21 (×3): qty 1

## 2020-07-21 MED ORDER — CLONAZEPAM 1 MG PO TABS
1.0000 mg | ORAL_TABLET | Freq: Two times a day (BID) | ORAL | Status: DC
  Administered 2020-07-21 – 2020-07-22 (×2): 1 mg via ORAL
  Filled 2020-07-21 (×2): qty 1

## 2020-07-21 NOTE — Tx Team (Signed)
Initial Treatment Plan 07/21/2020 11:51 PM Ivan Hicks MKL:[id number]    PATIENT STRESSORS: Other: being here and not home playing his music   PATIENT STRENGTHS: Average or above average intelligence Supportive family/friends   PATIENT IDENTIFIED PROBLEMS: Anxiety   Self neglect (d/t recent break up with girlfriend)  (pt wants to go home; believes he is here because of his parents worrying about his self-neglect)                 DISCHARGE CRITERIA:  Improved stabilization in mood, thinking, and/or behavior Motivation to continue treatment in a less acute level of care Verbal commitment to aftercare and medication compliance  PRELIMINARY DISCHARGE PLAN: Attend aftercare/continuing care group Outpatient therapy Return to previous living arrangement Return to previous work or school arrangements  PATIENT/FAMILY INVOLVEMENT: This treatment plan has been presented to and reviewed with the patient, Ivan Hicks, and/or family member.  The patient and family have been given the opportunity to ask questions and make suggestions.  Victorino December, RN 07/21/2020, 11:51 PM

## 2020-07-21 NOTE — ED Notes (Signed)
Hourly rounding reveals patient in room. No complaints, stable, in no acute distress. Q15 minute rounds and monitoring via Security Cameras to continue. 

## 2020-07-21 NOTE — ED Provider Notes (Signed)
Emergency Medicine Observation Re-evaluation Note  Ivan Hicks is a 19 y.o. male, seen on rounds today.  Pt initially presented to the ED for complaints of IVC Currently, the patient is resting comfortably.  Physical Exam  BP 128/88   Pulse 88   Temp 98.1 F (36.7 C) (Oral)   Resp 18   Ht 5\' 9"  (1.753 m)   Wt 72.6 kg   SpO2 98%   BMI 23.63 kg/m  Physical Exam General: No acute distress Cardiac: Well-perfused extremities Lungs: No respiratory distress Psych: Appropriate mood and affect  ED Course / MDM  EKG:EKG Interpretation  Date/Time:  Tuesday July 19 2020 01:32:32 EDT Ventricular Rate:  61 PR Interval:  150 QRS Duration: 90 QT Interval:  432 QTC Calculation: 434 R Axis:   81 Text Interpretation: Normal sinus rhythm Normal ECG Confirmed by UNCONFIRMED, DOCTOR (03-08-2007), editor [id number], Tammy [phone]) on 07/19/2020 10:35:55 AM  Clinical Course as of Jul 21 1105  Tue Jul 19, 2020  0020 Called father, 0021, twice - call could not be completed   [DS]  0021 Called mother, [name],  "of course he wouldn't tell you..." Full conversations in his own head Violent, threatening his parents, frequent cursing.  Doesn't sleep for days Won't bathe.  Snapping fingers and pacing constantly, talking to himself.  Dad on the phone now.  Reinforces that he's smart, manipulative "His room makes a hoarder look good"   [DS]  0028 He does smoke cigarettes, but no concerns about drug use.   [DS]  0028 Very verbally abusive. Frequently hysterically laughing, then switch flips to yelling and cussing at no one. Sometimes pretends to be on the phone, but mom then talks to friend and confirms patient never was on the phone.   Mom locks her own door in their home.  Butcher knife on the floor on Mom's room today. She's concerned that she accidentally pissed him off too much. She's concerned about her own safety, which is why she called [phone].    [DS]  0039 The patient has been  placed in psychiatric observation due to the need to provide a safe environment for the patient while obtaining psychiatric consultation and evaluation, as well as ongoing medical and medication management to treat the patient's condition. The patient has been placed under full IVC at this time.     [DS]    Clinical Course User Index [DS] 0040, MD   I have reviewed the labs performed to date as well as medications administered while in observation.  No recent changes in the last 24 hours  Plan  Current plan is for psychiatric placement. Patient is under full IVC at this time.   Delton Prairie, MD 07/21/20 [phone]

## 2020-07-21 NOTE — ED Notes (Signed)
Attempt to call report to [place] [facility], on hold for nurse for 15:06 minutes, hung up and recalled with no answer.

## 2020-07-21 NOTE — ED Notes (Signed)
Patient to day room calm and cooperative, requesting something to drink.

## 2020-07-21 NOTE — BH Assessment (Addendum)
Patient has been accepted to [facility].  Patient assigned to room 405-1 Accepted on the by Haynes Hoehn, NP on the behalf of Dr. Jola Babinski.  Call report to [phone]  Representative was [place].   ER Staff is aware of it:  Misty Stanley, ER Rogelia Boga, Patient's Nurse     Address: [address],  [place], [place] [address]  Would can come at 7pm.
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
## 2020-07-21 NOTE — BH Assessment (Signed)
Referral checks:  Ivan Hicks ([id number]), No answer   [place] [phone] or [phone]), Per Aurther Loft, there is no psychiatrist on duty to review referrals; beds are avaliable; this writer recommended to call back later in the morning.

## 2020-07-21 NOTE — ED Notes (Signed)
emtala reviewed 

## 2020-07-21 NOTE — ED Notes (Signed)
[facility]  DEPT  CALLED  FOR  TRANSPORT  TO  [facility]  AT  6:30PM

## 2020-07-21 NOTE — ED Notes (Signed)
Multiple attempts to call report to [facility], no answer from unit

## 2020-07-22 DIAGNOSIS — T450X4D Poisoning by antiallergic and antiemetic drugs, undetermined, subsequent encounter: Secondary | ICD-10-CM

## 2020-07-22 DIAGNOSIS — F23 Brief psychotic disorder: Secondary | ICD-10-CM | POA: Diagnosis not present

## 2020-07-22 DIAGNOSIS — T7422XA Child sexual abuse, confirmed, initial encounter: Secondary | ICD-10-CM | POA: Diagnosis not present

## 2020-07-22 LAB — LIPID PANEL
Cholesterol: 156 mg/dL (ref 0–200)
HDL: 45 mg/dL (ref >40)
LDL Cholesterol: 103 mg/dL — ABNORMAL HIGH (ref 0–99)
Total CHOL/HDL Ratio: 3.5 RATIO
Triglycerides: 39 mg/dL (ref <150)
VLDL: 8 mg/dL (ref 0–40)

## 2020-07-22 LAB — TSH: TSH: 2.29 u[IU]/mL (ref 0.350–4.500)

## 2020-07-22 MED ORDER — OLANZAPINE 10 MG PO TABS
20.0000 mg | ORAL_TABLET | Freq: Every day | ORAL | Status: DC
  Administered 2020-07-22 – 2020-07-23 (×2): 20 mg via ORAL
  Filled 2020-07-22 (×5): qty 2

## 2020-07-22 MED ORDER — TRAZODONE HCL 50 MG PO TABS
50.0000 mg | ORAL_TABLET | Freq: Every evening | ORAL | Status: DC | PRN
  Administered 2020-07-22 – 2020-07-26 (×4): 50 mg via ORAL
  Filled 2020-07-22 (×3): qty 1
  Filled 2020-07-22: qty 7
  Filled 2020-07-22 (×2): qty 1

## 2020-07-22 MED ORDER — HYDROXYZINE HCL 25 MG PO TABS
25.0000 mg | ORAL_TABLET | Freq: Three times a day (TID) | ORAL | Status: DC | PRN
  Administered 2020-07-22 – 2020-07-27 (×6): 25 mg via ORAL
  Filled 2020-07-22 (×5): qty 1
  Filled 2020-07-22: qty 10
  Filled 2020-07-22 (×2): qty 1

## 2020-07-22 MED ORDER — OLANZAPINE 5 MG PO TABS
5.0000 mg | ORAL_TABLET | Freq: Two times a day (BID) | ORAL | Status: DC | PRN
  Administered 2020-07-26: 5 mg via ORAL
  Filled 2020-07-22: qty 1
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
## 2020-07-22 NOTE — Progress Notes (Signed)
   07/21/20 2016  Psych Admission Type (Psych Patients Only)  Admission Status Involuntary  Psychosocial Assessment  Patient Complaints Irritability;Crying spells;Anxiety;Hyperactivity  Eye Contact Brief  Facial Expression Anxious;Animated  Affect Anxious;Depressed  Speech Logical/coherent;Rapid;Tangential  Interaction Assertive  Motor Activity Restless;Hyperactive  Appearance/Hygiene Disheveled;In scrubs  Behavior Characteristics Cooperative;Anxious;Irritable;Pacing  Mood Anxious;Sad  Thought Process  Coherency Circumstantial  Content Blaming others;Blaming self  Delusions None reported or observed  Perception WDL  Hallucination None reported or observed  Judgment WDL  Confusion None  Danger to Self  Current suicidal ideation? Denies  Danger to Others  Danger to Others None reported or observed   Pt has no prior MH or IP history. Pt states that he has been in a "dark place" d/t a break-up a couple months ago. Pt also has ramblings in crayon on several sheets of blank paper that was placed on his chart. 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 Pt also likes making music and wants to get back to that.
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
## 2020-07-22 NOTE — Tx Team (Signed)
Interdisciplinary Treatment and Diagnostic Plan Update  07/22/2020 Time of Session: 9:25am  [name] MRN: [id number]  Principal Diagnosis: MDD (major depressive disorder)  Secondary Diagnoses: Principal Problem:   MDD (major depressive disorder)   Current Medications:  Current Facility-Administered Medications  Medication Dose Route Frequency Provider Last Rate Last Admin  . clonazePAM (KLONOPIN) tablet 1 mg  1 mg Oral BID Ivan Maes, NP   1 mg at 07/22/20 1000  . DULoxetine (CYMBALTA) DR capsule 20 mg  20 mg Oral Daily Ivan Maes, NP   20 mg at 07/22/20 0958  . haloperidol (HALDOL) tablet 2 mg  2 mg Oral BID Ivan Maes, NP   2 mg at 07/22/20 0958  . influenza vac split quadrivalent PF (FLUARIX) injection 0.5 mL  0.5 mL Intramuscular Tomorrow-1000 Lindon Romp A, NP      . nicotine polacrilex (NICORETTE) gum 2 mg  2 mg Oral PRN Lindon Romp A, NP      . trihexyphenidyl (ARTANE) tablet 1 mg  1 mg Oral BID WC Ivan Maes, NP   1 mg at 07/22/20 9163   PTA Medications: Medications Prior to Admission  Medication Sig Dispense Refill Last Dose  . albuterol (VENTOLIN HFA) 108 (90 Base) MCG/ACT inhaler Inhale 1-2 puffs into the lungs every 6 (six) hours as needed for wheezing or shortness of breath. (Patient not taking: Reported on 07/19/2020) 18 g 0   . brompheniramine-pseudoephedrine-DM 30-2-10 MG/5ML syrup Take 5 mLs by mouth 3 (three) times daily as needed. (Patient not taking: Reported on 07/19/2020) 118 mL 0     Patient Stressors: Other: being here and not home playing his music  Patient Strengths: Average or above average intelligence Supportive family/friends  Treatment Modalities: Medication Management, Group therapy, Case management,  1 to 1 session with clinician, Psychoeducation, Recreational therapy.   Physician Treatment Plan for Primary Diagnosis: MDD (major depressive disorder) Long Term Goal(s):     Short Term Goals:    Medication  Management: Evaluate patient's response, side effects, and tolerance of medication regimen.  Therapeutic Interventions: 1 to 1 sessions, Unit Group sessions and Medication administration.  Evaluation of Outcomes: Not Met  Physician Treatment Plan for Secondary Diagnosis: Principal Problem:   MDD (major depressive disorder)  Long Term Goal(s):     Short Term Goals:       Medication Management: Evaluate patient's response, side effects, and tolerance of medication regimen.  Therapeutic Interventions: 1 to 1 sessions, Unit Group sessions and Medication administration.  Evaluation of Outcomes: Not Met   RN Treatment Plan for Primary Diagnosis: MDD (major depressive disorder) Long Term Goal(s): Knowledge of disease and therapeutic regimen to maintain health will improve  Short Term Goals: Ability to remain free from injury will improve, Ability to demonstrate self-control, Ability to participate in decision making will improve, Ability to identify and develop effective coping behaviors will improve and Compliance with prescribed medications will improve  Medication Management: RN will administer medications as ordered by provider, will assess and evaluate patient's response and provide education to patient for prescribed medication. RN will report any adverse and/or side effects to prescribing provider.  Therapeutic Interventions: 1 on 1 counseling sessions, Psychoeducation, Medication administration, Evaluate responses to treatment, Monitor vital signs and CBGs as ordered, Perform/monitor CIWA, COWS, AIMS and Fall Risk screenings as ordered, Perform wound care treatments as ordered.  Evaluation of Outcomes: Not Met   LCSW Treatment Plan for Primary Diagnosis: MDD (major depressive disorder) Long Term Goal(s): Safe transition to appropriate next level  of care at discharge, Engage patient in therapeutic group addressing interpersonal concerns.  Short Term Goals: Engage patient in  aftercare planning with referrals and resources, Increase social support, Increase emotional regulation, Facilitate acceptance of mental health diagnosis and concerns, Identify triggers associated with mental health/substance abuse issues and Increase skills for wellness and recovery  Therapeutic Interventions: Assess for all discharge needs, 1 to 1 time with Social worker, Explore available resources and support systems, Assess for adequacy in community support network, Educate family and significant other(s) on suicide prevention, Complete Psychosocial Assessment, Interpersonal group therapy.  Evaluation of Outcomes: Not Met   Progress in Treatment: Attending groups: Yes. Participating in groups: Yes. Taking medication as prescribed: Yes. Toleration medication: Yes. Family/Significant other contact made: No, will contact:  If consents are given  Patient understands diagnosis: Yes. and No. Discussing patient identified problems/goals with staff: Yes. Medical problems stabilized or resolved: Yes. Denies suicidal/homicidal ideation: Yes. Issues/concerns per patient self-inventory: No.   New problem(s) identified: No, Describe:  None   New Short Term/Long Term Goal(s): medication stabilization, elimination of SI thoughts, development of comprehensive mental wellness plan.   Patient Goals:  "Nothing because I am ready to go home now"   Discharge Plan or Barriers: Patient recently admitted. CSW will continue to follow and assess for appropriate referrals and possible discharge planning.   Reason for Continuation of Hospitalization: Hallucinations Medication stabilization Suicidal ideation  Estimated Length of Stay: 3 to 5 days   Attendees: Patient: Ivan Hicks  07/22/2020   Physician: Melba Coon, MD 07/22/2020   Nursing:  07/22/2020   RN Care Manager: 07/22/2020   Social Worker: Verdis Frederickson, LCSW 07/22/2020   Recreational Therapist:  07/22/2020   Other:  07/22/2020    Other:  07/22/2020   Other: 07/22/2020      Scribe for Treatment Team: Darleen Crocker, LCSWA 07/22/2020 11:02 AM
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
## 2020-07-22 NOTE — Progress Notes (Signed)
Patient denies SI, HI and AVH this shift.  Patient has had no incidents of behavioral dyscontrol this shift.  Patient has been active in the milieu and was engaged appropriately with peers.    Assess patient for safety, offer medications as prescribed, engage patient in 1:1 staff talks.   Patient able to contract for safety continue to monitor as planned.  

## 2020-07-23 DIAGNOSIS — F23 Brief psychotic disorder: Secondary | ICD-10-CM | POA: Diagnosis not present

## 2020-07-23 DIAGNOSIS — T7422XA Child sexual abuse, confirmed, initial encounter: Secondary | ICD-10-CM | POA: Diagnosis not present

## 2020-07-23 DIAGNOSIS — T450X4D Poisoning by antiallergic and antiemetic drugs, undetermined, subsequent encounter: Secondary | ICD-10-CM | POA: Diagnosis not present

## 2020-07-23 DIAGNOSIS — F323 Major depressive disorder, single episode, severe with psychotic features: Secondary | ICD-10-CM | POA: Diagnosis not present

## 2020-07-23 LAB — PROLACTIN: Prolactin: 33.4 ng/mL — ABNORMAL HIGH (ref 4.0–15.2)

## 2020-07-23 LAB — HEMOGLOBIN A1C
Hgb A1c MFr Bld: 5 % (ref 4.8–5.6)
Mean Plasma Glucose: 97 mg/dL

## 2020-07-23 NOTE — Progress Notes (Signed)
   07/23/20 0024  Psych Admission Type (Psych Patients Only)  Admission Status Involuntary  Psychosocial Assessment  Patient Complaints Irritability  Eye Contact Brief  Facial Expression Anxious;Animated  Affect Anxious;Depressed  Speech Logical/coherent;Rapid;Tangential  Interaction Assertive  Motor Activity Restless;Hyperactive  Appearance/Hygiene Disheveled;In scrubs  Behavior Characteristics Anxious;Fidgety  Mood Anxious;Labile  Thought Process  Coherency Circumstantial  Content Blaming others;Blaming self  Delusions None reported or observed  Perception WDL  Hallucination None reported or observed  Judgment WDL  Confusion None  Danger to Self  Current suicidal ideation? Denies  Danger to Others  Danger to Others None reported or observed  D: Patient reports he has been complaint with medication. Pt stated he is tolerating medication well. A: Medications administered as prescribed. Support and encouragement provided as needed.  R: Patient remains safe on the unit. Will continue to monitor for safety and stability.
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
## 2020-07-23 NOTE — Progress Notes (Signed)
Pt reports that he is ready for discharge and requests additional review of his chart in order to be granted same.  He reports that he has been conversing with his mother (with whom he resides) and she is agreeable to him coming home after discharge.  He denies auditory, visual hallucinations and has no suicidal nor homicidal thoughts.

## 2020-07-23 NOTE — Progress Notes (Signed)
Pt visible in bed on initial contact. Presents restless, anxious with avertive with tangential speech. Denies SI, HI, AVH and pain when assessed. Rated his anxiety 3/10 when assessed "I just want to go home that all. Since I can't go home, I'm just going to sleep the days away till I can leave. Spent majority of the morning in bed. Pt did not attend groups when prompted. Observed pacing in milieu when awake. Reports he's tolerated meals and medications well.  Emotional support and encouragement offered. Q15 minutes safety checks maintained without incident.  Denies concerns at this time. Safety maintained on and off unit.

## 2020-07-24 DIAGNOSIS — T450X4D Poisoning by antiallergic and antiemetic drugs, undetermined, subsequent encounter: Secondary | ICD-10-CM | POA: Diagnosis not present

## 2020-07-24 DIAGNOSIS — T7422XA Child sexual abuse, confirmed, initial encounter: Secondary | ICD-10-CM | POA: Diagnosis not present

## 2020-07-24 DIAGNOSIS — F322 Major depressive disorder, single episode, severe without psychotic features: Secondary | ICD-10-CM | POA: Diagnosis not present

## 2020-07-24 DIAGNOSIS — F23 Brief psychotic disorder: Secondary | ICD-10-CM | POA: Diagnosis not present

## 2020-07-24 MED ORDER — BENZTROPINE MESYLATE 1 MG/ML IJ SOLN: 2.0000 mg | Freq: Two times a day (BID) | INTRAMUSCULAR | Status: DC | PRN

## 2020-07-24 MED ORDER — BENZTROPINE MESYLATE 1 MG PO TABS: 2.0000 mg | ORAL_TABLET | Freq: Two times a day (BID) | ORAL | Status: DC | PRN

## 2020-07-24 MED ORDER — RISPERIDONE 2 MG PO TABS
2.0000 mg | ORAL_TABLET | Freq: Two times a day (BID) | ORAL | Status: DC
  Administered 2020-07-24 – 2020-07-27 (×6): 2 mg via ORAL
  Filled 2020-07-24: qty 14
  Filled 2020-07-24: qty 1
  Filled 2020-07-24 (×2): qty 14
  Filled 2020-07-24 (×2): qty 1
  Filled 2020-07-24: qty 14
  Filled 2020-07-24 (×4): qty 1
  Filled 2020-07-24 (×2): qty 14
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
## 2020-07-24 NOTE — BHH Group Notes (Signed)
LCSW Group Therapy Note  07/24/2020   10:00-11:00am   Type of Therapy and Topic:  Group Therapy: Anger Cues and Responses  Participation Level:  Active   Description of Group:   In this group, patients learned how to recognize the physical, cognitive, emotional, and behavioral responses they have to anger-provoking situations.  They identified a recent time they became angry and how they reacted.  They analyzed how their reaction was possibly beneficial and how it was possibly unhelpful.  The group discussed a variety of healthier coping skills that could help with such a situation in the future.  Focus was placed on how helpful it is to recognize the underlying emotions to our anger, because working on those can lead to a more permanent solution as well as our ability to focus on the important rather than the urgent.  Therapeutic Goals: 1. Patients will remember their last incident of anger and how they felt emotionally and physically, what their thoughts were at the time, and how they behaved. 2. Patients will identify how their behavior at that time worked for them, as well as how it worked against them. 3. Patients will explore possible new behaviors to use in future anger situations. 4. Patients will learn that anger itself is normal and cannot be eliminated, and that healthier reactions can assist with resolving conflict rather than worsening situations.  Summary of Patient Progress:  Pt was late to group so did not participate in ice breaker however did share that anger is uncontrollable and that people bring out the worst. Pt shared that music is a positive coping strategy. Pt reports there should be access to youtube and music here.   Therapeutic Modalities:   Cognitive Behavioral Therapy  Shellia Cleverly

## 2020-07-24 NOTE — BHH Counselor (Signed)
Pt requested a 1:1 after group. Pt shared feeling really upset that people are lying on him and keep asking him are you still hearing voices. Pt reports he was never hearing voices and it triggers him to be asked that. CSW validated his feelings but explained that is part of the job to ensure those tough questions are asked. Pt asked for crosswords. Another pt gave pt a crossword book in the dayroom.
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
## 2020-07-24 NOTE — Progress Notes (Signed)
   07/24/20 0200  Psychosocial Assessment  Patient Complaints Anxiety  Eye Contact Avertive;Brief  Facial Expression Animated;Anxious;Pensive  Affect Appropriate to circumstance  Speech Logical/coherent;Tangential  Interaction Assertive  Motor Activity Fidgety  Appearance/Hygiene Disheveled  Behavior Characteristics Cooperative  Mood Anxious  Thought Process  Coherency Circumstantial  Content Blaming others;Blaming self  Delusions None reported or observed  Perception WDL  Hallucination None reported or observed  Judgment WDL  Confusion None  Danger to Self  Current suicidal ideation? Denies  Danger to Others  Danger to Others None reported or observed  Pt compliant with medications denies SI/HI/A/VH. Support and encouragement provided as needed q15 minutes safety checks ongoing without self harm gesture.

## 2020-07-25 MED ORDER — GABAPENTIN 100 MG PO CAPS
100.0000 mg | ORAL_CAPSULE | Freq: Three times a day (TID) | ORAL | Status: DC
  Administered 2020-07-25 – 2020-07-26 (×5): 100 mg via ORAL
  Filled 2020-07-25 (×2): qty 1
  Filled 2020-07-25 (×2): qty 21
  Filled 2020-07-25 (×2): qty 1
  Filled 2020-07-25: qty 21
  Filled 2020-07-25 (×3): qty 1
  Filled 2020-07-25 (×3): qty 21

## 2020-07-25 NOTE — Progress Notes (Signed)
Pt had minimal interaction with peers / staff     07/25/20 2200  Psych Admission Type (Psych Patients Only)  Admission Status Involuntary  Psychosocial Assessment  Patient Complaints Anxiety  Eye Contact Avertive;Brief  Facial Expression Pensive  Affect Blunted  Speech Logical/coherent  Interaction Attention-seeking  Motor Activity Pacing  Appearance/Hygiene Unremarkable  Behavior Characteristics Cooperative  Mood Sad  Thought Process  Coherency Circumstantial  Content Preoccupation  Delusions None reported or observed  Perception UTA  Hallucination None reported or observed  Judgment Poor  Confusion WDL  Danger to Self  Current suicidal ideation? Denies  Danger to Others  Danger to Others None reported or observed

## 2020-07-25 NOTE — Progress Notes (Signed)
The patient learned today to try to be more patient when talking. He states that he tends to have air in his chest when talking since he interrupts people and understands that he needs to talk slower. His goal for tomorrow is to continue to cope and get discharged.

## 2020-07-25 NOTE — Progress Notes (Signed)
Recreation Therapy Notes  Date: 10.18.21 Time: 0930 Location: [address]  Group Topic: Stress Management  Goal Area(s) Addresses:  Patient will identify positive stress management techniques. Patient will identify benefits of using stress management post d/c.  Intervention: Stress Management  Activity:  Meditation.  LRT played a meditation that focused on being resilient.  Patients were to listen and follow along as meditation played to engage in activity.    Education:  Stress Management, Discharge Planning.   Education Outcome: Acknowledges Education  Clinical Observations/Feedback: Pt did not attend group session.    Caroll Rancher, LRT/CTRS         Caroll Rancher A 07/25/2020 11:43 AM

## 2020-07-25 NOTE — Progress Notes (Signed)
Ivan Hicks NOVEL CORONAVIRUS (COVID-19) DAILY CHECK-OFF SYMPTOMS - answer yes or no to each - every day NO YES  Have you had a fever in the past 24 hours?  . Fever (Temp > 37.80C / 100F) X   Have you had any of these symptoms in the past 24 hours? . New Cough .  Sore Throat  .  Shortness of Breath .  Difficulty Breathing .  Unexplained Body Aches   X   Have you had any one of these symptoms in the past 24 hours not related to allergies?   . Runny Nose .  Nasal Congestion .  Sneezing   X   If you have had runny nose, nasal congestion, sneezing in the past 24 hours, has it worsened?  X   EXPOSURES - check yes or no X   Have you traveled outside the state in the past 14 days?  X   Have you been in contact with someone with a confirmed diagnosis of COVID-19 or PUI in the past 14 days without wearing appropriate PPE?  X   Have you been living in the same home as a person with confirmed diagnosis of COVID-19 or a PUI (household contact)?    X   Have you been diagnosed with COVID-19?    X              What to do next: Answered NO to all: Answered YES to anything:   Proceed with unit schedule Follow the [facility] Inpatient Flowsheet.   

## 2020-07-25 NOTE — [facility] Group Notes (Signed)
[facility] LCSW Group Therapy  07/25/2020 2:10 PM  Type of Therapy:  Damiah Mcdonald Management   Participation Level:  Active  Participation Quality:  Appropriate  Affect:  Anxious  Cognitive:  Appropriate  Insight:  Developing/Improving  Engagement in Therapy:  Developing/Improving  Modes of Intervention:  Discussion  Summary of Progress/Problems: Teryl states that he likes to listen to music to help calm down.  Gaylen states that he often gets angry after becoming overwhelmed with anxiety and stress.  Vint participated in group and remained there the entire time.   Metro Kung Cyanna Neace 07/25/2020, 2:10 PM
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
## 2020-07-25 NOTE — Progress Notes (Signed)
   07/25/20 1000  Psych Admission Type (Psych Patients Only)  Admission Status Involuntary  Psychosocial Assessment  Patient Complaints Anxiety  Eye Contact Avertive;Brief  Facial Expression Pensive  Speech Logical/coherent  Interaction Attention-seeking  Motor Activity Pacing  Appearance/Hygiene Unremarkable  Behavior Characteristics Cooperative  Mood Anxious  Thought Process  Coherency Circumstantial  Content Preoccupation  Delusions None reported or observed  Perception UTA  Hallucination None reported or observed  Judgment Poor  Confusion WDL  Danger to Self  Current suicidal ideation? Denies  Danger to Others  Danger to Others None reported or observed

## 2020-07-25 NOTE — Progress Notes (Signed)
Psychoeducational Group Note  Date:  07/25/2020 Time:  1750  Group Topic/Focus:  Developing a Wellness Toolbox:   The focus of this group is to help patients develop a "wellness toolbox" with skills and strategies to promote recovery upon discharge.  Participation Level: Did Not Attend  Participation Quality:  Not Applicable  Affect:  Not Applicable  Cognitive:  Not Applicable  Insight:  Not Applicable  Engagement in Group: Not Applicable  Additional Comments:  Pt refused to attend the group session this afternoon.  Breona Cherubin E 07/25/2020, 5:50 PM

## 2020-07-26 MED ORDER — GABAPENTIN 100 MG PO CAPS
200.0000 mg | ORAL_CAPSULE | Freq: Three times a day (TID) | ORAL | Status: DC
  Administered 2020-07-26 – 2020-07-27 (×3): 200 mg via ORAL
  Filled 2020-07-26: qty 2
  Filled 2020-07-26: qty 42
  Filled 2020-07-26: qty 2
  Filled 2020-07-26: qty 42
  Filled 2020-07-26: qty 2
  Filled 2020-07-26: qty 42
  Filled 2020-07-26 (×2): qty 2
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
## 2020-07-26 NOTE — Progress Notes (Signed)
The patient rated his day as a 7 out of 10 since he described his day as, "it was average". His goal for tomorrow is to figure out when he will be discharged.

## 2020-07-26 NOTE — Progress Notes (Signed)
Recreation Therapy Notes  Animal-Assisted Activity (AAA) Program Checklist/Progress Notes Patient Eligibility Criteria Checklist & Daily Group note for Rec Tx Intervention  Date: 10.19.21 Time: 1430 Location: [address]   AAA/T Program Assumption of Risk Form signed by Engineer, production or Parent Legal Guardian  YES  Patient is free of allergies or sever asthma  YES  Patient reports no fear of animals  YES  Patient reports no history of cruelty to animals YES  Patient understands his/her participation is voluntary  YES  Patient washes hands before animal contact  YES   Patient washes hands after animal contact  YES    Education: Charity fundraiser, Appropriate Animal Interaction   Education Outcome: Acknowledges understanding/In group clarification offered/Needs additional education.  Clinical Observations/Feedback: Pt did not attend.    Caroll Rancher, LRT/CTRS         Lillia Abed, Gilda Abboud A 07/26/2020 3:30 PM

## 2020-07-26 NOTE — Progress Notes (Signed)
Pt appeared to be minimizing situation, pt forwards little information this evening

## 2020-07-26 NOTE — Progress Notes (Signed)
   07/26/20 0636  Vital Signs  Pulse Rate 83  BP 128/66  BP Location Right Arm  BP Method Automatic  Patient Position (if appropriate) Standing   D: Patient denies SI/HI/AVH. Patient rated anxiety 2/10 and depression 0/10. Pt. Attended group. Pt. Had an increase in anxiety in the afternoon. A:  Patient took scheduled medicine. Pt. Given 25 mg of Vistaril in the afternoon for anxiety.  Support and encouragement provided Routine safety checks conducted every 15 minutes. Patient  Informed to notify staff with any concerns.  R: Safety maintained.

## 2020-07-27 LAB — PROLACTIN: Prolactin: 48.7 ng/mL — ABNORMAL HIGH (ref 4.0–15.2)

## 2020-07-27 MED ORDER — TRAZODONE HCL 50 MG PO TABS: 50.0000 mg | ORAL_TABLET | Freq: Every evening | ORAL | 0 refills | Status: DC | PRN

## 2020-07-27 MED ORDER — GABAPENTIN 100 MG PO CAPS
200.0000 mg | ORAL_CAPSULE | Freq: Three times a day (TID) | ORAL | 0 refills | Status: DC
Start: 2020-07-27 — End: 2020-09-28

## 2020-07-27 MED ORDER — NICOTINE POLACRILEX 2 MG MT GUM
2.0000 mg | CHEWING_GUM | OROMUCOSAL | 0 refills | Status: DC | PRN
Start: 2020-07-27 — End: 2021-07-13

## 2020-07-27 MED ORDER — RISPERIDONE 2 MG PO TABS
2.0000 mg | ORAL_TABLET | Freq: Two times a day (BID) | ORAL | 0 refills | Status: DC
Start: 2020-07-27 — End: 2020-09-28
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
## 2020-07-27 NOTE — Progress Notes (Signed)
Pt has no insight into situation, pt stated he just wanted to leave and did not want to be here, pt forwarded little information to writer    07/27/20 0000  Psych Admission Type (Psych Patients Only)  Admission Status Involuntary  Psychosocial Assessment  Patient Complaints Anxiety  Eye Contact Brief  Facial Expression Anxious  Affect Blunted  Speech Logical/coherent;Argumentative  Interaction Attention-seeking  Motor Activity Pacing  Appearance/Hygiene Unremarkable  Behavior Characteristics Guarded  Mood Anxious;Labile  Thought Process  Coherency Circumstantial  Content Preoccupation  Delusions None reported or observed  Perception UTA  Hallucination None reported or observed  Judgment Poor  Confusion WDL  Danger to Self  Current suicidal ideation? Denies  Danger to Others  Danger to Others None reported or observed

## 2020-07-27 NOTE — Progress Notes (Signed)
  [facility] Adult Case Management Discharge Plan :  Will you be returning to the same living situation after discharge:  Yes,  Home At discharge, do you have transportation home?: Yes,  Mother  Do you have the ability to pay for your medications: Yes,  Family   Release of information consent forms completed and in the chart;  Patient's signature needed at discharge.  Patient to Follow up at:  Follow-up Information    [facility].   Specialty: Professional Counselor Why: You may inquire with this provider regarding therapy and medication management services.  New patient walk in hours:  Monday through Friday, 8:30 am to 12:00 pm and 1:00 pm to 2:30 pm. Contact information: [address] [place] [address] [phone]        [facility] Go on 07/29/2020.   Why: You have a hospital follow up appointment for therapy and medication management on 07/29/20 at 12:30 pm.  This appointment will be held in person.  Please bring any prescription medications. Contact information: [address] [place] [address] [phone]               Next level of care provider has access to [facility] Link:no  Safety Planning and Suicide Prevention discussed: Yes,  Mother and patient   Have you used any form of tobacco in the last 30 days? (Cigarettes, Smokeless Tobacco, Cigars, and/or Pipes): Yes  Has patient been referred to the Quitline?: Patient refused referral  Patient has been referred for addiction treatment: N/A  Aram Beecham, LCSWA 07/27/2020, 10:04 AM

## 2020-08-04 ENCOUNTER — Other Ambulatory Visit: Payer: Self-pay | Admitting: Student in an Organized Health Care Education/Training Program

## 2020-09-13 ENCOUNTER — Ambulatory Visit: Payer: Self-pay | Admitting: Pharmacy Technician

## 2020-09-13 DIAGNOSIS — Z79899 Other long term (current) drug therapy: Secondary | ICD-10-CM

## 2020-09-15 ENCOUNTER — Other Ambulatory Visit: Payer: Self-pay

## 2020-09-15 NOTE — Progress Notes (Signed)
Provided patient with application for [facility] due to recent hospital visit.  Patient agreed to be responsible for gathering financial information and forwarding to appropriate department in [facility].    Completed [facility] application and contract.  Patient agreed to all terms of the [facility] contract.    Patient approved to receive medication assistance at [facility] until time for re-certification in 3845, and as long as eligibility criteria continues to be met.    Provided patient with Civil engineer, contracting based on his particular needs.    [facility]

## 2020-09-26 ENCOUNTER — Encounter: Payer: Self-pay | Admitting: Internal Medicine

## 2020-09-28 ENCOUNTER — Other Ambulatory Visit: Payer: Self-pay

## 2020-09-28 ENCOUNTER — Ambulatory Visit (HOSPITAL_COMMUNITY)
Admission: EM | Admit: 2020-09-28 | Discharge: 2020-09-28 | Disposition: A | Discharge status: Home / Self Care | Payer: No Payment, Other | Attending: Student | Admitting: Student

## 2020-09-28 ENCOUNTER — Encounter (HOSPITAL_COMMUNITY): Payer: Self-pay | Admitting: Student

## 2020-09-28 DIAGNOSIS — Z1330 Encounter for screening examination for mental health and behavioral disorders, unspecified: Secondary | ICD-10-CM | POA: Diagnosis not present

## 2020-09-28 DIAGNOSIS — F39 Unspecified mood [affective] disorder: Secondary | ICD-10-CM

## 2020-09-28 MED ORDER — GABAPENTIN 100 MG PO CAPS
200.0000 mg | ORAL_CAPSULE | Freq: Three times a day (TID) | ORAL | Status: DC
  Administered 2020-09-28: 200 mg via ORAL
  Filled 2020-09-28: qty 2

## 2020-09-28 MED ORDER — TRAZODONE HCL 50 MG PO TABS
50.0000 mg | ORAL_TABLET | Freq: Every evening | ORAL | 0 refills | Status: DC | PRN
Start: 2020-09-28 — End: 2020-09-28

## 2020-09-28 MED ORDER — GABAPENTIN 100 MG PO CAPS: 200.0000 mg | ORAL_CAPSULE | Freq: Three times a day (TID) | ORAL | 0 refills | Status: DC

## 2020-09-28 MED ORDER — RISPERIDONE 2 MG PO TABS: 2.0000 mg | ORAL_TABLET | Freq: Two times a day (BID) | ORAL | 0 refills | Status: DC

## 2020-09-28 MED ORDER — TRAZODONE HCL 50 MG PO TABS
50.0000 mg | ORAL_TABLET | Freq: Every evening | ORAL | 0 refills | Status: DC | PRN
Start: 2020-09-28 — End: 2021-07-13

## 2020-09-28 MED ORDER — RISPERIDONE 2 MG PO TABS
2.0000 mg | ORAL_TABLET | Freq: Two times a day (BID) | ORAL | Status: DC
  Administered 2020-09-28: 2 mg via ORAL
  Filled 2020-09-28: qty 1

## 2020-09-28 MED ORDER — TRAZODONE HCL 50 MG PO TABS: 50.0000 mg | ORAL_TABLET | Freq: Every evening | ORAL | Status: DC | PRN

## 2020-09-28 MED ORDER — GABAPENTIN 100 MG PO CAPS
200.0000 mg | ORAL_CAPSULE | Freq: Three times a day (TID) | ORAL | 0 refills | Status: DC
Start: 2020-09-28 — End: 2021-08-08
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
## 2020-09-28 NOTE — ED Triage Notes (Signed)
Patient walked into the [facility], Denies SI, HI , and AVH. Patient wanting medication management. Patient receives medication from [facility] and the medication is not working for patient. Patient has been on medications since October 2021, (Neurontin, trazodone, Risperdal).

## 2020-09-28 NOTE — Discharge Instructions (Signed)

## 2020-10-10 ENCOUNTER — Ambulatory Visit
Admission: EM | Admit: 2020-10-10 | Discharge: 2020-10-10 | Disposition: A | Discharge status: Home / Self Care | Payer: HRSA Program | Attending: Sports Medicine | Admitting: Sports Medicine

## 2020-10-10 ENCOUNTER — Other Ambulatory Visit: Payer: Self-pay

## 2020-10-10 DIAGNOSIS — J069 Acute upper respiratory infection, unspecified: Secondary | ICD-10-CM | POA: Diagnosis not present

## 2020-10-10 DIAGNOSIS — R0981 Nasal congestion: Secondary | ICD-10-CM

## 2020-10-10 DIAGNOSIS — Z20822 Contact with and (suspected) exposure to covid-19: Secondary | ICD-10-CM | POA: Insufficient documentation

## 2020-10-10 MED ORDER — PSEUDOEPH-BROMPHEN-DM 30-2-10 MG/5ML PO SYRP: 10.0000 mL | ORAL_SOLUTION | Freq: Four times a day (QID) | ORAL | 0 refills | Status: AC | PRN
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
## 2020-10-10 NOTE — Discharge Instructions (Addendum)

## 2020-10-10 NOTE — ED Triage Notes (Signed)
Pt c/o productive cough with clear sputum, congestion, runny nose, sore throat for 3-4 days. Reports loss of taste/smell 4 days ago, but states smell/taste has returned.  Denies fever, n/v/d, SOB.  Has been taking mucinex, tylenol-last taken yesterday.

## 2020-10-11 LAB — SARS CORONAVIRUS 2 (TAT 6-24 HRS): SARS Coronavirus 2: NEGATIVE

## 2020-11-05 IMAGING — CR DG CHEST 2V
2 series · 2 of 2 positions shown · non-contrast
Comparison: None.

CLINICAL DATA: Cough. Dyspnea. Fever. Pleuritic chest pain. Current
smoker.

EXAM:
CHEST - 2 VIEW

[chest pa]
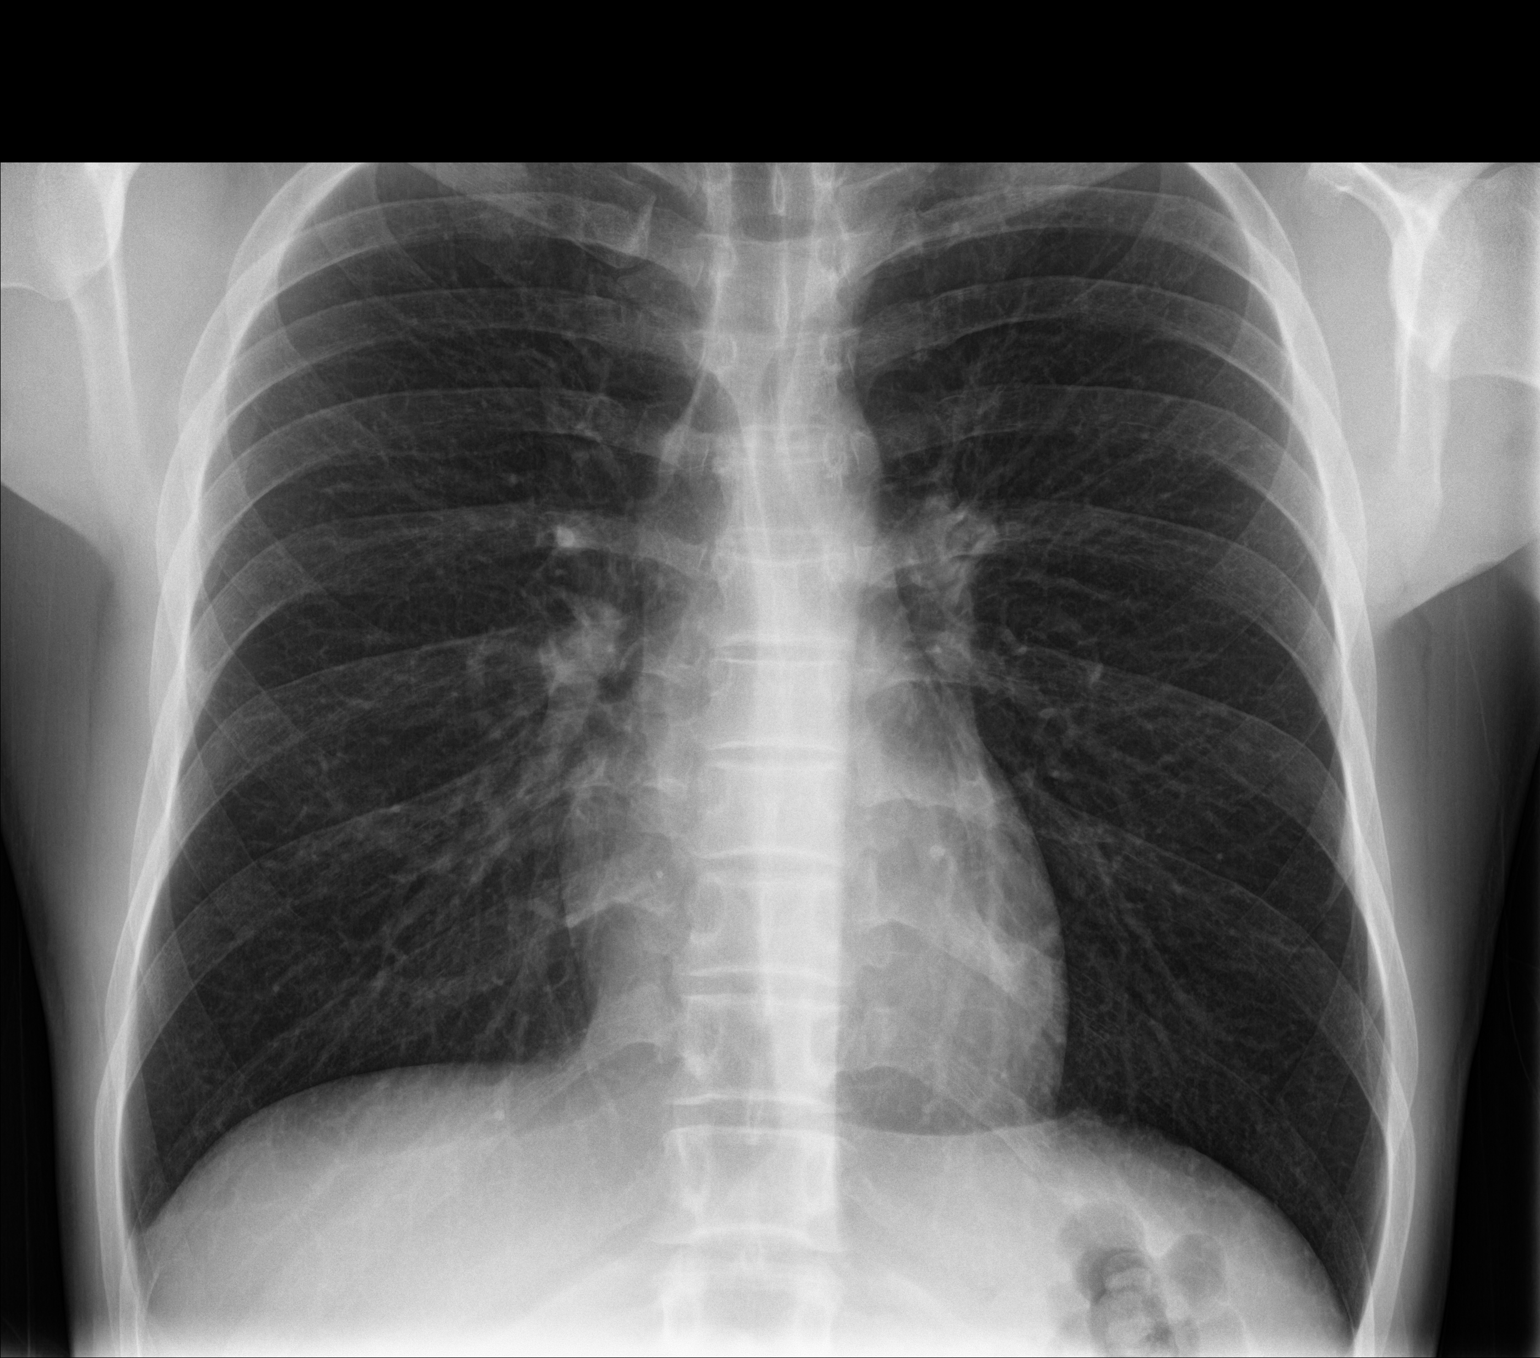

[chest lat]
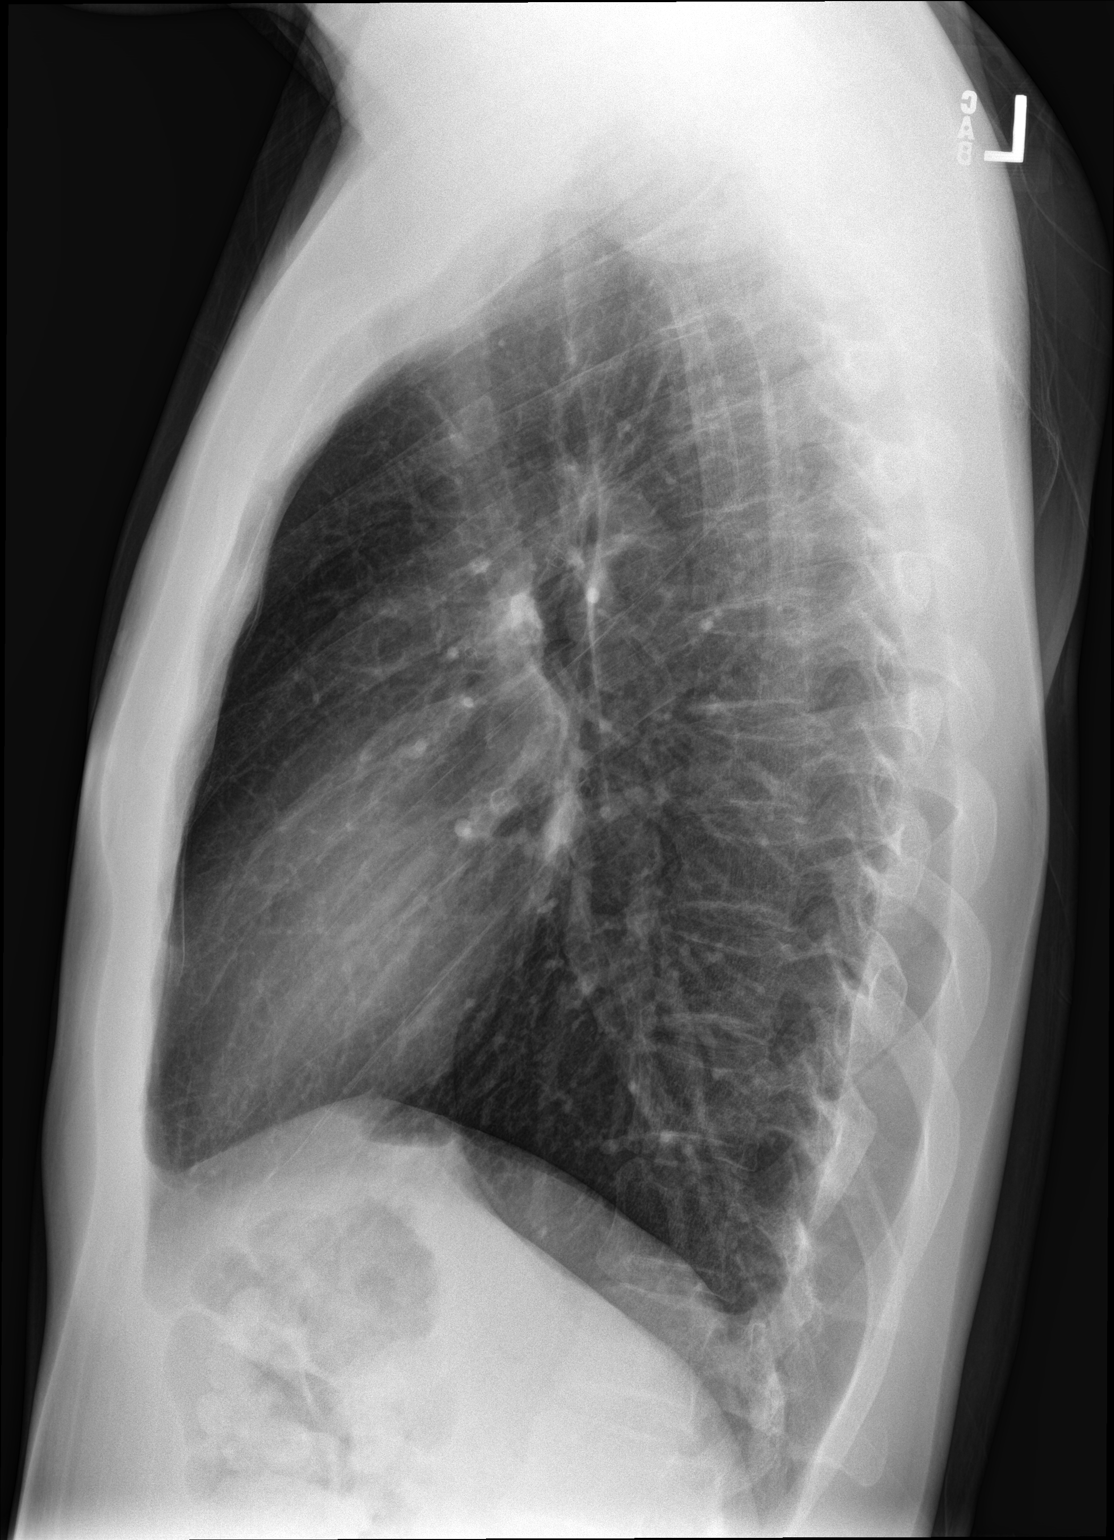

[2 of 2 positions shown; findings below may reference images not displayed]

FINDINGS: Normal heart size. Normal mediastinal contour. No pneumothorax. No
pleural effusion. Lungs appear clear, with no acute consolidative
airspace disease and no pulmonary edema.
IMPRESSION: No active cardiopulmonary disease.

## 2020-11-14 ENCOUNTER — Telehealth (HOSPITAL_COMMUNITY): Payer: Self-pay | Admitting: *Deleted

## 2020-11-14 NOTE — Telephone Encounter (Signed)
VM from patients father asking to speak with Dr Lucianne Muss as his son is needing medicine. Reviewed record, I dont see any contact with outpatient and he has no pending appts. Last at [facility] in 10/22. Will forward request to Dr Lucianne Muss.

## 2020-11-15 ENCOUNTER — Other Ambulatory Visit: Payer: Self-pay | Admitting: Psychiatry

## 2021-01-24 ENCOUNTER — Other Ambulatory Visit: Payer: Self-pay

## 2021-01-25 ENCOUNTER — Other Ambulatory Visit: Payer: Self-pay

## 2021-01-25 MED FILL — Risperidone Tab 4 MG: ORAL | 30 days supply | Qty: 30 | Fill #0 | Status: AC

## 2021-01-31 ENCOUNTER — Other Ambulatory Visit: Payer: Self-pay

## 2021-04-27 ENCOUNTER — Other Ambulatory Visit: Payer: Self-pay

## 2021-04-29 ENCOUNTER — Other Ambulatory Visit: Payer: Self-pay

## 2021-04-29 ENCOUNTER — Ambulatory Visit (HOSPITAL_COMMUNITY)
Admission: EM | Admit: 2021-04-29 | Discharge: 2021-04-29 | Disposition: A | Discharge status: Home / Self Care | Payer: No Payment, Other | Attending: Nurse Practitioner | Admitting: Nurse Practitioner

## 2021-04-29 DIAGNOSIS — Z560 Unemployment, unspecified: Secondary | ICD-10-CM | POA: Insufficient documentation

## 2021-04-29 DIAGNOSIS — Z79899 Other long term (current) drug therapy: Secondary | ICD-10-CM | POA: Insufficient documentation

## 2021-04-29 DIAGNOSIS — F323 Major depressive disorder, single episode, severe with psychotic features: Secondary | ICD-10-CM | POA: Insufficient documentation
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
## 2021-04-29 NOTE — Discharge Instructions (Addendum)
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
## 2021-04-29 NOTE — ED Notes (Signed)
Discharge instructions given to father - informed that prescriptions were called to RX on file - verbalizes understanding

## 2021-04-29 NOTE — ED Notes (Signed)
No locker 

## 2021-04-29 NOTE — ED Notes (Signed)
Snack and juice given

## 2021-05-01 ENCOUNTER — Other Ambulatory Visit: Payer: Self-pay

## 2021-05-01 ENCOUNTER — Telehealth: Payer: Self-pay | Admitting: Pharmacy Technician

## 2021-05-01 NOTE — Telephone Encounter (Signed)
Father of patient, [name] called and spoke with the Pharmacy regarding refills for his son's medications.  Pharmacy staff told father that son needed to update financial information.  Father requested that annual re-certification packet be e-mailed to [email or web address].  Re-certification packet e-mailed to father.  Sherilyn Dacosta Care Manager [facility]

## 2021-05-05 ENCOUNTER — Other Ambulatory Visit: Payer: Self-pay

## 2021-05-08 ENCOUNTER — Other Ambulatory Visit: Payer: Self-pay

## 2021-05-09 ENCOUNTER — Other Ambulatory Visit: Payer: Self-pay

## 2021-05-09 MED ORDER — RISPERIDONE 2 MG PO TABS
2.0000 mg | ORAL_TABLET | Freq: Two times a day (BID) | ORAL | 0 refills | Status: DC
  Filled 2021-05-09: qty 60, 30d supply, fill #0

## 2021-05-09 MED ORDER — GABAPENTIN 100 MG PO CAPS
100.0000 mg | ORAL_CAPSULE | Freq: Three times a day (TID) | ORAL | 0 refills | Status: DC
  Filled 2021-05-09: qty 180, 30d supply, fill #0

## 2021-05-11 ENCOUNTER — Telehealth: Payer: Self-pay | Admitting: Pharmacist

## 2021-05-11 NOTE — Telephone Encounter (Signed)
Patient approved for medication assistance at [facility] until 01/06/22, as long as eligibility criteria continues to be met.   Vonda Henderson [facility] Administrative Assistant 

## 2021-05-22 ENCOUNTER — Other Ambulatory Visit: Payer: Self-pay

## 2021-06-19 ENCOUNTER — Other Ambulatory Visit: Payer: Self-pay

## 2021-06-21 ENCOUNTER — Other Ambulatory Visit (HOSPITAL_COMMUNITY): Payer: Self-pay | Admitting: *Deleted

## 2021-06-21 ENCOUNTER — Other Ambulatory Visit: Payer: Self-pay

## 2021-06-22 ENCOUNTER — Other Ambulatory Visit: Payer: Self-pay

## 2021-06-23 ENCOUNTER — Other Ambulatory Visit: Payer: Self-pay

## 2021-07-13 ENCOUNTER — Ambulatory Visit: Payer: Self-pay | Admitting: Gerontology

## 2021-07-13 ENCOUNTER — Encounter: Payer: Self-pay | Admitting: Gerontology

## 2021-07-13 ENCOUNTER — Other Ambulatory Visit: Payer: Self-pay

## 2021-07-13 VITALS — BP 137/84 | HR 79 | Temp 97.1°F | Resp 21 | Ht 68.0 in | Wt 143.2 lb

## 2021-07-13 DIAGNOSIS — Z7689 Persons encountering health services in other specified circumstances: Secondary | ICD-10-CM

## 2021-07-13 DIAGNOSIS — Z8659 Personal history of other mental and behavioral disorders: Secondary | ICD-10-CM | POA: Insufficient documentation

## 2021-07-13 NOTE — Progress Notes (Signed)
New Patient Office Visit  Subjective:  Patient ID: Ivan Hicks, male    DOB: [birth year]  Age: 20 y.o. MRN: [id number]  CC:  Chief Complaint  Patient presents with   Establish Care    HPI Ivan Hicks  is a 20 year old male who does not have significant medical history, presents to establish care. He states that he has a history of anxiety, and states "that the only thing that helps with his anxiety was his Father giving him one tablet of Xanax.'  He states that his mood is good, denies suicidal not homicidal ideation.He smokes 5 cigarettes daily and admits the desire to quit. He denies chest pain, palpitation, light headiness and vision changes. Overall, he states that he's doing well, and offers no further complaint.  History reviewed. No pertinent past medical history.  Past Surgical History:  Procedure Laterality Date   NO PAST SURGERIES      Family History  Problem Relation Age of Onset   Other Mother        unknown medical history   Hypertension Father    Other Maternal Grandmother        unknown medical history   Other Maternal Grandfather        unknown medical history   Other Paternal Grandmother        unknown medical history   Other Paternal Grandfather        unknown medical history    Social History   Socioeconomic History   Marital status: Single    Spouse name: Not on file   Number of children: Not on file   Years of education: Not on file   Highest education level: Not on file  Occupational History   Not on file  Tobacco Use   Smoking status: Every Day    Packs/day: 0.25    Years: 7.00    Pack years: 1.75    Types: Cigarettes   Smokeless tobacco: Never  Vaping Use   Vaping Use: Former   Quit date: 03/13/2021   Substances: Nicotine, Flavoring  Substance and Sexual Activity   Alcohol use: Yes    Comment: special occasions   Drug use: No   Sexual activity: Never  Other Topics Concern   Not on file  Social History Narrative   Not on  file   Social Determinants of Health   Financial Resource Strain: Not on file  Food Insecurity: No Food Insecurity   Worried About Running Out of Food in the Last Year: Never true   [place] in the Last Year: Never true  Transportation Needs: No Transportation Needs   Lack of Transportation (Medical): No   Lack of Transportation (Non-Medical): No  Physical Activity: Not on file  Stress: Not on file  Social Connections: Not on file  Intimate Partner Violence: Not on file    ROS Review of Systems  Constitutional: Negative.   HENT: Negative.    Eyes: Negative.   Respiratory: Negative.    Cardiovascular: Negative.   Gastrointestinal: Negative.   Endocrine: Negative.   Genitourinary: Negative.   Musculoskeletal: Negative.   Skin: Negative.   Neurological: Negative.   Psychiatric/Behavioral:  The patient is nervous/anxious.    Objective:   Today's Vitals: BP 137/84 (BP Location: Right Arm, Patient Position: Sitting, Cuff Size: Normal)   Pulse 79   Temp (!) 97.1 F (36.2 C)   Resp (!) 21   Ht 5' 8" (1.727 m)   Wt 143  lb 3.2 oz (65 kg)   SpO2 98%   BMI 21.77 kg/m   Physical Exam HENT:     Head: Normocephalic and atraumatic.     Nose: Nose normal.     Mouth/Throat:     Mouth: Mucous membranes are moist.  Eyes:     Extraocular Movements: Extraocular movements intact.     Conjunctiva/sclera: Conjunctivae normal.     Pupils: Pupils are equal, round, and reactive to light.  Cardiovascular:     Rate and Rhythm: Normal rate and regular rhythm.     Pulses: Normal pulses.     Heart sounds: Normal heart sounds.  Pulmonary:     Effort: Pulmonary effort is normal.     Breath sounds: Normal breath sounds.  Abdominal:     General: Abdomen is flat. Bowel sounds are normal.     Palpations: Abdomen is soft.  Genitourinary:    Comments: Deferred per patient Musculoskeletal:        General: Normal range of motion.     Cervical back: Normal range of motion.  Skin:     General: Skin is warm.  Neurological:     General: No focal deficit present.     Mental Status: He is alert and oriented to person, place, and time. Mental status is at baseline.  Psychiatric:        Mood and Affect: Mood normal.        Behavior: Behavior normal.        Thought Content: Thought content normal.        Judgment: Judgment normal.    Assessment & Plan:    1. Encounter to establish care -Routine labs will be checked. - Comp Met (CMET); Future - CBC w/Diff; Future - TSH; Future - Lipid panel; Future - Urinalysis; Future - TSH - CBC w/Diff - Comp Met (CMET) - Urinalysis - Lipid panel  2. History of anxiety - He will follow up with [facility] Ms. Pruitt Heather. He was advised to call the Crisis help line with worsening symptoms and also notify clinic.      Follow-up: Return in about 26 days (around 08/08/2021), or if symptoms worsen or fail to improve.   Laiken Sandy Jerold Coombe, NP

## 2021-07-20 LAB — COMPREHENSIVE METABOLIC PANEL
ALT: 27 IU/L (ref 0–44)
AST: 31 IU/L (ref 0–40)
Albumin/Globulin Ratio: 2.8 — ABNORMAL HIGH (ref 1.2–2.2)
Albumin: 5 g/dL (ref 4.1–5.2)
Alkaline Phosphatase: 118 IU/L (ref 44–121)
BUN/Creatinine Ratio: 12 (ref 9–20)
BUN: 10 mg/dL (ref 6–20)
Bilirubin Total: 0.4 mg/dL (ref 0.0–1.2)
CO2: 20 mmol/L (ref 20–29)
Calcium: 10.1 mg/dL (ref 8.7–10.2)
Chloride: 103 mmol/L (ref 96–106)
Creatinine, Ser: 0.85 mg/dL (ref 0.76–1.27)
Globulin, Total: 1.8 g/dL (ref 1.5–4.5)
Glucose: 73 mg/dL (ref 70–99)
Potassium: 4 mmol/L (ref 3.5–5.2)
Sodium: 139 mmol/L (ref 134–144)
Total Protein: 6.8 g/dL (ref 6.0–8.5)
eGFR: 126 mL/min/{1.73_m2} (ref >59)

## 2021-07-20 LAB — LIPID PANEL
Chol/HDL Ratio: 3.1 ratio (ref 0.0–5.0)
Cholesterol, Total: 148 mg/dL (ref 100–199)
HDL: 48 mg/dL (ref >39)
LDL Chol Calc (NIH): 87 mg/dL (ref 0–99)
Triglycerides: 63 mg/dL (ref 0–149)
VLDL Cholesterol Cal: 13 mg/dL (ref 5–40)

## 2021-07-20 LAB — TSH: TSH: 0.885 u[IU]/mL (ref 0.450–4.500)

## 2021-08-01 ENCOUNTER — Other Ambulatory Visit: Payer: Self-pay

## 2021-08-01 ENCOUNTER — Institutional Professional Consult (permissible substitution): Payer: Self-pay | Admitting: Licensed Clinical Social Worker

## 2021-08-01 ENCOUNTER — Ambulatory Visit: Payer: Self-pay | Admitting: Licensed Clinical Social Worker

## 2021-08-01 DIAGNOSIS — F332 Major depressive disorder, recurrent severe without psychotic features: Secondary | ICD-10-CM
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
## 2021-08-08 ENCOUNTER — Ambulatory Visit: Payer: Self-pay | Admitting: Gerontology

## 2021-08-08 ENCOUNTER — Other Ambulatory Visit: Payer: Self-pay

## 2021-08-08 ENCOUNTER — Encounter: Payer: Self-pay | Admitting: Gerontology

## 2021-08-08 VITALS — BP 123/79 | HR 79 | Temp 97.6°F | Resp 16 | Ht 69.0 in | Wt 137.5 lb

## 2021-08-08 DIAGNOSIS — Z09 Encounter for follow-up examination after completed treatment for conditions other than malignant neoplasm: Secondary | ICD-10-CM

## 2021-08-08 NOTE — Progress Notes (Signed)
Established Patient Office Visit  Subjective:  Patient ID: Ivan Hicks, male    DOB: 05-14-2001  Age: 20 y.o. MRN: [id number]  CC:  Chief Complaint  Patient presents with   Follow-up    Labs drawn 07/13/21. Will need to recollect CBC and U/A.    HPI Ivan Hicks is a 20 year old male who does not have significant medical history,presents for lab review. He was accompanied by his mother. His labs done on 07/13/21 were unremarkable. He denies chest pain, palpitation, light headedness, and states that his mood is good, denies suicidal nor homicidal ideation. His mother requested if a test could be performed to check his Serotonin levels. Per Dr Octavia Heir, he recommended referral to [place]. Overall, he states that he's doing well and offers no further complaint.  History reviewed. No pertinent past medical history.  Past Surgical History:  Procedure Laterality Date   NO PAST SURGERIES      Family History  Problem Relation Age of Onset   Spondylolysis Mother    Hypertension Father    Other Sister        unknown medical history   Diabetes Maternal Grandmother        pre-diabetes   Osteoarthritis Maternal Grandmother    Hypertension Maternal Grandfather    COPD Maternal Grandfather    Other Paternal Grandmother        unknown medical history   Other Paternal Grandfather        unknown medical history    Social History   Socioeconomic History   Marital status: Single    Spouse name: Not on file   Number of children: Not on file   Years of education: Not on file   Highest education level: Not on file  Occupational History   Not on file  Tobacco Use   Smoking status: Every Day    Packs/day: 0.25    Years: 7.00    Pack years: 1.75    Types: Cigarettes   Smokeless tobacco: Never  Vaping Use   Vaping Use: Former   Quit date: 03/13/2021   Substances: Nicotine, Flavoring  Substance and Sexual Activity   Alcohol use: Yes    Comment: special occasions or party   Drug  use: No   Sexual activity: Never  Other Topics Concern   Not on file  Social History Narrative   Not on file   Social Determinants of Health   Financial Resource Strain: Not on file  Food Insecurity: No Food Insecurity   Worried About [place] in the Last Year: Never true   [place] in the Last Year: Never true  Transportation Needs: No Transportation Needs   Lack of Transportation (Medical): No   Lack of Transportation (Non-Medical): No  Physical Activity: Not on file  Stress: Not on file  Social Connections: Not on file  Intimate Partner Violence: Not on file    Outpatient Medications Prior to Visit  Medication Sig Dispense Refill   gabapentin (NEURONTIN) 100 MG capsule Take 2 capsules (200 mg total) by mouth 3 (three) times daily. (Patient not taking: Reported on 08/08/2021) 90 capsule 0   No facility-administered medications prior to visit.    No Known Allergies  ROS Review of Systems  Constitutional: Negative.   Respiratory: Negative.    Cardiovascular: Negative.   Neurological: Negative.   Psychiatric/Behavioral: Negative.       Objective:    Physical Exam HENT:     Head:  Normocephalic and atraumatic.  Cardiovascular:     Rate and Rhythm: Normal rate and regular rhythm.     Pulses: Normal pulses.     Heart sounds: Normal heart sounds.  Pulmonary:     Effort: Pulmonary effort is normal.     Breath sounds: Normal breath sounds.  Musculoskeletal:        General: Normal range of motion.  Neurological:     General: No focal deficit present.     Mental Status: He is alert and oriented to person, place, and time. Mental status is at baseline.  Psychiatric:        Mood and Affect: Mood normal.        Behavior: Behavior normal.        Thought Content: Thought content normal.        Judgment: Judgment normal.    BP 123/79 (BP Location: Left Arm, Patient Position: Sitting, Cuff Size: Normal)   Pulse 79   Temp 97.6 F (36.4 C) (Oral)    Resp 16   Ht _0  (1.753 m)   Wt 137 lb 8 oz (62.4 kg)   SpO2 97%   BMI 20.31 kg/m  Wt Readings from Last 3 Encounters:  08/08/21 137 lb 8 oz (62.4 kg)  07/13/21 143 lb 3.2 oz (65 kg)  10/10/20 160 lb (72.6 kg)     Health Maintenance Due  Topic Date Due   COVID-19 Vaccine (1) Never done   Pneumococcal Vaccine 21-76 Years old (1 - PCV) Never done   HPV VACCINES (1 - Male 2-dose series) Never done   HIV Screening  Never done   Hepatitis C Screening  Never done   TETANUS/TDAP  Never done   INFLUENZA VACCINE  05/08/2021       Topic Date Due   HPV VACCINES (1 - Male 2-dose series) Never done    Lab Results  Component Value Date   TSH 0.885 07/13/2021   Lab Results  Component Value Date   WBC 10.1 07/18/2020   HGB 15.4 07/18/2020   HCT 43.5 07/18/2020   MCV 93.8 07/18/2020   PLT 267 07/18/2020   Lab Results  Component Value Date   NA 139 07/13/2021   K 4.0 07/13/2021   CO2 20 07/13/2021   GLUCOSE 73 07/13/2021   BUN 10 07/13/2021   CREATININE 0.85 07/13/2021   BILITOT 0.4 07/13/2021   ALKPHOS 118 07/13/2021   AST 31 07/13/2021   ALT 27 07/13/2021   PROT 6.8 07/13/2021   ALBUMIN 5.0 07/13/2021   CALCIUM 10.1 07/13/2021   ANIONGAP 10 07/18/2020   EGFR 126 07/13/2021   Lab Results  Component Value Date   CHOL 148 07/13/2021   Lab Results  Component Value Date   HDL 48 07/13/2021   Lab Results  Component Value Date   LDLCALC 87 07/13/2021   Lab Results  Component Value Date   TRIG 63 07/13/2021   Lab Results  Component Value Date   CHOLHDL 3.1 07/13/2021   Lab Results  Component Value Date   HGBA1C 5.0 07/22/2020      Assessment & Plan:      1. Encounter for follow-up - His labs were unremarkable,he was advised to go to the ED or call the crisis help line for worsening symptoms. - CBC w/Diff; Future - CBC w/Diff    Follow-up: Return in about 3 months (around 11/08/2021), or if symptoms worsen or fail to improve.    Denyce Harr Jerold Coombe, NP

## 2021-08-09 ENCOUNTER — Telehealth: Payer: Self-pay | Admitting: Licensed Clinical Social Worker

## 2021-08-09 LAB — CBC WITH DIFFERENTIAL/PLATELET
Basophils Absolute: 0.1 10*3/uL (ref 0.0–0.2)
Basos: 1 %
EOS (ABSOLUTE): 0.1 10*3/uL (ref 0.0–0.4)
Eos: 1 %
Hematocrit: 45.4 % (ref 37.5–51.0)
Hemoglobin: 15.7 g/dL (ref 13.0–17.7)
Immature Grans (Abs): 0 10*3/uL (ref 0.0–0.1)
Immature Granulocytes: 0 %
Lymphocytes Absolute: 2.5 10*3/uL (ref 0.7–3.1)
Lymphs: 26 %
MCH: 33 pg (ref 26.6–33.0)
MCHC: 34.6 g/dL (ref 31.5–35.7)
MCV: 95 fL (ref 79–97)
Monocytes Absolute: 0.9 10*3/uL (ref 0.1–0.9)
Monocytes: 9 %
Neutrophils Absolute: 6.1 10*3/uL (ref 1.4–7.0)
Neutrophils: 63 %
Platelets: 282 10*3/uL (ref 150–450)
RBC: 4.76 x10E6/uL (ref 4.14–5.80)
RDW: 11.6 % (ref 11.6–15.4)
WBC: 9.6 10*3/uL (ref 3.4–10.8)

## 2021-08-09 NOTE — Telephone Encounter (Signed)
Returned the patient's call spoke to his father, Stavros Cail, and explained that the patient needed to fill out a new release of information before clinician could discuss any protected patient information. Clinician offered to make the patient an appointment if he wanted a follow-up. Clinician explained that in the event of a crisis he should call [phone], or go to the nearest emergency department. [name] requested that Clinician pass on a message to his son's primary care doctor regarding his son's lab results.

## 2021-08-23 ENCOUNTER — Telehealth: Payer: Self-pay

## 2021-08-23 NOTE — Telephone Encounter (Signed)
Reached out to patient to clarify if he wanted [facility] to obtain records, release records, or just authorized two-way communication with [name] and [name] as all three boxes were checked but no office was listed on a ROI request dated 08/14/21.  Per patient, he does not want his care discussed with the above named individuals.  Added flag in chart to alert others of his request.  Will discard ROI at this time.

## 2021-09-21 ENCOUNTER — Encounter: Payer: Self-pay | Admitting: Gerontology

## 2021-09-21 ENCOUNTER — Other Ambulatory Visit: Payer: Self-pay

## 2021-09-21 ENCOUNTER — Ambulatory Visit: Payer: Self-pay | Admitting: Gerontology

## 2021-09-21 VITALS — BP 121/71 | HR 76 | Temp 97.4°F | Resp 16 | Ht 68.0 in | Wt 137.3 lb

## 2021-09-21 DIAGNOSIS — Z8659 Personal history of other mental and behavioral disorders: Secondary | ICD-10-CM

## 2021-09-21 NOTE — Progress Notes (Signed)
Established Patient Office Visit  Subjective:  Patient ID: Ivan Hicks, male    DOB: 21-Sep-2001  Age: 20 y.o. MRN: [id number]  CC:  Chief Complaint  Patient presents with   Follow-up    Labs drawn 08/08/21    HPI Ivan Hicks  is a 20 year old male who does not have significant medical history, presents for routine follow up visit and lab review. His CBC was unremarkable. He has not being to [place], he states that his mood is good, denies suicidal nor homicidal ideation. He states that he stays around in the house, listening to music, smoking 1/2 pack of cigarette daily. His Mom was present during visit and states that he is irritable sometimes, and he agrees that he will go to [facility] for evaluation and possibly medication since his physical check up was normal.  History reviewed. No pertinent past medical history.  Past Surgical History:  Procedure Laterality Date   TONSILLECTOMY      Family History  Problem Relation Age of Onset   Spondylolysis Mother    Hypertension Father    Other Sister        unknown medical history   Diabetes Maternal Grandmother        pre-diabetes   Osteoarthritis Maternal Grandmother    Hypertension Maternal Grandfather    COPD Maternal Grandfather    Other Paternal Grandmother        unknown medical history   Other Paternal Grandfather        unknown medical history    Social History   Socioeconomic History   Marital status: Single    Spouse name: Not on file   Number of children: Not on file   Years of education: Not on file   Highest education level: Not on file  Occupational History   Not on file  Tobacco Use   Smoking status: Every Day    Packs/day: 0.25    Years: 7.00    Pack years: 1.75    Types: Cigarettes   Smokeless tobacco: Never  Vaping Use   Vaping Use: Former   Quit date: 03/13/2021   Substances: Nicotine, Flavoring  Substance and Sexual Activity   Alcohol use: Yes    Comment: special occasions or party   Drug  use: No   Sexual activity: Never  Other Topics Concern   Not on file  Social History Narrative   Not on file   Social Determinants of Health   Financial Resource Strain: Not on file  Food Insecurity: No Food Insecurity   Worried About [place] in the Last Year: Never true   [place] in the Last Year: Never true  Transportation Needs: No Transportation Needs   Lack of Transportation (Medical): No   Lack of Transportation (Non-Medical): No  Physical Activity: Not on file  Stress: Not on file  Social Connections: Not on file  Intimate Partner Violence: Not on file    No outpatient medications prior to visit.   No facility-administered medications prior to visit.    No Known Allergies  ROS Review of Systems  Constitutional: Negative.   Respiratory: Negative.    Cardiovascular: Negative.   Neurological: Negative.   Psychiatric/Behavioral:  The patient is nervous/anxious.      Objective:    Physical Exam HENT:     Head: Normocephalic and atraumatic.  Cardiovascular:     Rate and Rhythm: Normal rate and regular rhythm.     Pulses: Normal  pulses.     Heart sounds: Normal heart sounds.  Pulmonary:     Effort: Pulmonary effort is normal.     Breath sounds: Normal breath sounds.  Neurological:     General: No focal deficit present.     Mental Status: He is alert and oriented to person, place, and time. Mental status is at baseline.  Psychiatric:        Attention and Perception: Attention normal.        Mood and Affect: Mood is anxious.        Speech: Speech is rapid and pressured.        Behavior: Behavior is hyperactive. Behavior is cooperative.        Thought Content: Thought content normal.    BP 121/71 (BP Location: Left Arm, Patient Position: Sitting, Cuff Size: Normal)    Pulse 76    Temp (!) 97.4 F (36.3 C) (Oral)    Resp 16    Ht _0  (1.727 m)    Wt 137 lb 4.8 oz (62.3 kg)    SpO2 98%    BMI 20.88 kg/m  Wt Readings from Last 3  Encounters:  09/21/21 137 lb 4.8 oz (62.3 kg)  08/08/21 137 lb 8 oz (62.4 kg)  07/13/21 143 lb 3.2 oz (65 kg)     Health Maintenance Due  Topic Date Due   COVID-19 Vaccine (1) Never done   Pneumococcal Vaccine 51-29 Years old (1 - PCV) Never done   HPV VACCINES (1 - Male 2-dose series) Never done   HIV Screening  Never done   Hepatitis C Screening  Never done   TETANUS/TDAP  Never done   INFLUENZA VACCINE  05/08/2021       Topic Date Due   HPV VACCINES (1 - Male 2-dose series) Never done    Lab Results  Component Value Date   TSH 0.885 07/13/2021   Lab Results  Component Value Date   WBC 9.6 08/08/2021   HGB 15.7 08/08/2021   HCT 45.4 08/08/2021   MCV 95 08/08/2021   PLT 282 08/08/2021   Lab Results  Component Value Date   NA 139 07/13/2021   K 4.0 07/13/2021   CO2 20 07/13/2021   GLUCOSE 73 07/13/2021   BUN 10 07/13/2021   CREATININE 0.85 07/13/2021   BILITOT 0.4 07/13/2021   ALKPHOS 118 07/13/2021   AST 31 07/13/2021   ALT 27 07/13/2021   PROT 6.8 07/13/2021   ALBUMIN 5.0 07/13/2021   CALCIUM 10.1 07/13/2021   ANIONGAP 10 07/18/2020   EGFR 126 07/13/2021   Lab Results  Component Value Date   CHOL 148 07/13/2021   Lab Results  Component Value Date   HDL 48 07/13/2021   Lab Results  Component Value Date   LDLCALC 87 07/13/2021   Lab Results  Component Value Date   TRIG 63 07/13/2021   Lab Results  Component Value Date   CHOLHDL 3.1 07/13/2021   Lab Results  Component Value Date   HGBA1C 5.0 07/22/2020      Assessment & Plan:    1. History of anxiety - He was advised to go to [facility] for mental evaluation and to notify clinic with any concerns.    Follow-up: Return in about 3 months (around 12/20/2021), or if symptoms worsen or fail to improve.    Jolynne Spurgin Jerold Coombe, NP

## 2021-10-23 ENCOUNTER — Other Ambulatory Visit: Payer: Self-pay

## 2021-10-23 MED ORDER — GABAPENTIN 100 MG PO CAPS
ORAL_CAPSULE | ORAL | 1 refills | Status: AC
  Filled 2021-10-23: qty 100, 17d supply, fill #0

## 2021-10-23 MED ORDER — RISPERIDONE 2 MG PO TABS
ORAL_TABLET | ORAL | 1 refills | Status: DC
  Filled 2021-10-23: qty 60, 30d supply, fill #0
  Filled 2021-11-24: qty 60, 30d supply, fill #1

## 2021-11-08 ENCOUNTER — Ambulatory Visit: Payer: Self-pay | Admitting: Gerontology

## 2021-11-24 ENCOUNTER — Other Ambulatory Visit: Payer: Self-pay

## 2021-11-27 ENCOUNTER — Other Ambulatory Visit: Payer: Self-pay

## 2021-11-27 MED ORDER — RISPERIDONE 2 MG PO TABS
2.0000 mg | ORAL_TABLET | Freq: Two times a day (BID) | ORAL | 3 refills | Status: DC
  Filled 2021-11-27 – 2021-12-24 (×2): qty 60, 30d supply, fill #0

## 2021-12-21 ENCOUNTER — Ambulatory Visit: Payer: Self-pay | Admitting: Gerontology

## 2021-12-25 ENCOUNTER — Other Ambulatory Visit: Payer: Self-pay

## 2022-02-09 ENCOUNTER — Other Ambulatory Visit: Payer: Self-pay

## 2022-02-26 ENCOUNTER — Other Ambulatory Visit: Payer: Self-pay

## 2022-02-26 MED ORDER — HYDROXYZINE PAMOATE 25 MG PO CAPS
ORAL_CAPSULE | ORAL | 1 refills | Status: AC
  Filled 2022-02-26: qty 90, 30d supply, fill #0
  Filled 2022-04-24: qty 90, 30d supply, fill #1

## 2022-02-26 MED ORDER — QUETIAPINE FUMARATE 25 MG PO TABS
ORAL_TABLET | ORAL | 1 refills | Status: DC
  Filled 2022-02-26: qty 30, 30d supply, fill #0

## 2022-02-27 ENCOUNTER — Other Ambulatory Visit: Payer: Self-pay

## 2022-03-01 ENCOUNTER — Other Ambulatory Visit: Payer: Self-pay | Admitting: Pharmacy Technician

## 2022-03-01 DIAGNOSIS — Z79899 Other long term (current) drug therapy: Secondary | ICD-10-CM

## 2022-03-01 NOTE — Progress Notes (Signed)
Received updated proof of income.  Patient eligible to receive medication assistance at [facility] until time for re-certification in 2024, and as long as eligibility requirements continue to be met.  Jaquelin Meaney J. Seward Coran Care Manager [facility]  

## 2022-03-19 ENCOUNTER — Other Ambulatory Visit: Payer: Self-pay

## 2022-03-19 MED ORDER — QUETIAPINE FUMARATE 25 MG PO TABS
ORAL_TABLET | ORAL | 1 refills | Status: DC
  Filled 2022-03-19: qty 90, 90d supply, fill #0

## 2022-03-19 MED ORDER — QUETIAPINE FUMARATE 25 MG PO TABS
ORAL_TABLET | ORAL | 1 refills | Status: DC
  Filled 2022-03-19: qty 30, 30d supply, fill #0
  Filled 2022-04-24: qty 30, 30d supply, fill #1

## 2022-04-23 ENCOUNTER — Other Ambulatory Visit: Payer: Self-pay

## 2022-04-24 ENCOUNTER — Other Ambulatory Visit: Payer: Self-pay

## 2022-05-10 ENCOUNTER — Other Ambulatory Visit: Payer: Self-pay

## 2022-05-11 ENCOUNTER — Other Ambulatory Visit: Payer: Self-pay

## 2022-06-21 ENCOUNTER — Other Ambulatory Visit: Payer: Self-pay

## 2022-06-21 MED ORDER — QUETIAPINE FUMARATE 100 MG PO TABS
ORAL_TABLET | ORAL | 0 refills | Status: DC
  Filled 2022-06-21: qty 60, 30d supply, fill #0

## 2022-07-13 ENCOUNTER — Other Ambulatory Visit: Payer: Self-pay

## 2022-07-13 MED ORDER — QUETIAPINE FUMARATE 300 MG PO TABS
ORAL_TABLET | ORAL | 1 refills | Status: DC
  Filled 2022-07-13: qty 30, 30d supply, fill #0

## 2022-07-13 MED ORDER — QUETIAPINE FUMARATE 400 MG PO TABS
ORAL_TABLET | ORAL | 1 refills | Status: DC
  Filled 2022-07-13: qty 30, 30d supply, fill #0

## 2022-07-23 ENCOUNTER — Other Ambulatory Visit: Payer: Self-pay

## 2022-08-17 ENCOUNTER — Other Ambulatory Visit: Payer: Self-pay

## 2022-08-17 MED ORDER — ARIPIPRAZOLE 20 MG PO TABS
20.0000 mg | ORAL_TABLET | Freq: Every evening | ORAL | 0 refills | Status: DC
  Filled 2022-08-17: qty 30, 30d supply, fill #0

## 2022-09-13 ENCOUNTER — Other Ambulatory Visit: Payer: Self-pay

## 2022-09-13 MED ORDER — ARIPIPRAZOLE 20 MG PO TABS
20.0000 mg | ORAL_TABLET | Freq: Every day | ORAL | 1 refills | Status: DC
  Filled 2022-09-13: qty 30, 30d supply, fill #0
  Filled 2022-10-24: qty 30, 30d supply, fill #1

## 2022-09-19 ENCOUNTER — Other Ambulatory Visit: Payer: Self-pay

## 2022-10-24 ENCOUNTER — Other Ambulatory Visit: Payer: Self-pay

## 2022-11-23 ENCOUNTER — Other Ambulatory Visit: Payer: Self-pay

## 2022-11-23 MED ORDER — ARIPIPRAZOLE 20 MG PO TABS
20.0000 mg | ORAL_TABLET | Freq: Every day | ORAL | 2 refills | Status: AC
  Filled 2022-11-23 – 2022-12-14 (×2): qty 30, 30d supply, fill #0
  Filled 2023-02-07: qty 30, 30d supply, fill #1
  Filled 2023-06-03: qty 30, 30d supply, fill #2

## 2022-12-11 ENCOUNTER — Other Ambulatory Visit: Payer: Self-pay

## 2022-12-12 ENCOUNTER — Encounter: Payer: Medicaid Other | Admitting: Internal Medicine

## 2022-12-13 ENCOUNTER — Other Ambulatory Visit: Payer: Self-pay | Admitting: Internal Medicine

## 2022-12-13 ENCOUNTER — Other Ambulatory Visit: Payer: Medicaid Other

## 2022-12-14 ENCOUNTER — Other Ambulatory Visit: Payer: Self-pay

## 2022-12-14 LAB — COMPREHENSIVE METABOLIC PANEL
ALT: 25 IU/L (ref 0–44)
AST: 21 IU/L (ref 0–40)
Albumin/Globulin Ratio: 2.2 (ref 1.2–2.2)
Albumin: 4.9 g/dL (ref 4.3–5.2)
Alkaline Phosphatase: 118 IU/L (ref 44–121)
BUN/Creatinine Ratio: 19 (ref 9–20)
BUN: 14 mg/dL (ref 6–20)
Bilirubin Total: 0.4 mg/dL (ref 0.0–1.2)
CO2: 18 mmol/L — ABNORMAL LOW (ref 20–29)
Calcium: 9.5 mg/dL (ref 8.7–10.2)
Chloride: 104 mmol/L (ref 96–106)
Creatinine, Ser: 0.74 mg/dL — ABNORMAL LOW (ref 0.76–1.27)
Globulin, Total: 2.2 g/dL (ref 1.5–4.5)
Glucose: 93 mg/dL (ref 70–99)
Potassium: 4.1 mmol/L (ref 3.5–5.2)
Sodium: 142 mmol/L (ref 134–144)
Total Protein: 7.1 g/dL (ref 6.0–8.5)
eGFR: 130 mL/min/{1.73_m2} (ref >59)

## 2022-12-14 LAB — LIPID PANEL W/O CHOL/HDL RATIO
Cholesterol, Total: 148 mg/dL (ref 100–199)
HDL: 54 mg/dL (ref >39)
LDL Chol Calc (NIH): 82 mg/dL (ref 0–99)
Triglycerides: 60 mg/dL (ref 0–149)
VLDL Cholesterol Cal: 12 mg/dL (ref 5–40)

## 2022-12-14 LAB — CBC WITH DIFFERENTIAL/PLATELET
Basophils Absolute: 0 10*3/uL (ref 0.0–0.2)
Basos: 0 %
EOS (ABSOLUTE): 0.1 10*3/uL (ref 0.0–0.4)
Eos: 2 %
Hematocrit: 46.4 % (ref 37.5–51.0)
Hemoglobin: 15.9 g/dL (ref 13.0–17.7)
Immature Grans (Abs): 0 10*3/uL (ref 0.0–0.1)
Immature Granulocytes: 0 %
Lymphocytes Absolute: 2.7 10*3/uL (ref 0.7–3.1)
Lymphs: 37 %
MCH: 33 pg (ref 26.6–33.0)
MCHC: 34.3 g/dL (ref 31.5–35.7)
MCV: 96 fL (ref 79–97)
Monocytes Absolute: 0.6 10*3/uL (ref 0.1–0.9)
Monocytes: 9 %
Neutrophils Absolute: 3.9 10*3/uL (ref 1.4–7.0)
Neutrophils: 52 %
Platelets: 265 10*3/uL (ref 150–450)
RBC: 4.82 x10E6/uL (ref 4.14–5.80)
RDW: 11.9 % (ref 11.6–15.4)
WBC: 7.4 10*3/uL (ref 3.4–10.8)

## 2022-12-17 ENCOUNTER — Encounter: Payer: Self-pay | Admitting: Internal Medicine

## 2022-12-17 ENCOUNTER — Ambulatory Visit (INDEPENDENT_AMBULATORY_CARE_PROVIDER_SITE_OTHER): Payer: Medicaid Other | Admitting: Internal Medicine

## 2022-12-17 VITALS — BP 110/80 | HR 85 | Ht 68.0 in | Wt 153.2 lb

## 2022-12-17 DIAGNOSIS — E872 Acidosis, unspecified: Secondary | ICD-10-CM | POA: Diagnosis not present

## 2022-12-17 DIAGNOSIS — Z0001 Encounter for general adult medical examination with abnormal findings: Secondary | ICD-10-CM | POA: Diagnosis not present

## 2022-12-17 DIAGNOSIS — F23 Brief psychotic disorder: Secondary | ICD-10-CM | POA: Diagnosis not present

## 2022-12-17 NOTE — Progress Notes (Signed)
Established Patient Office Visit  Subjective:  Patient ID: Ivan Hicks, male    DOB: 11-18-2000  Age: 22 y.o. MRN: [id number]  Chief Complaint  Patient presents with   Annual Exam    CPE    No new complaints, here for lab review and medication refills. Labs reviewed and only notable for non-ag acidosis, lipids wnl.   No past medical history on file.  Past Surgical History:  Procedure Laterality Date   TONSILLECTOMY      Social History   Socioeconomic History   Marital status: Single    Spouse name: Not on file   Number of children: Not on file   Years of education: Not on file   Highest education level: Not on file  Occupational History   Not on file  Tobacco Use   Smoking status: Every Day    Packs/day: 0.25    Years: 7.00    Total pack years: 1.75    Types: Cigarettes   Smokeless tobacco: Never  Vaping Use   Vaping Use: Former   Quit date: 03/13/2021   Substances: Nicotine, Flavoring  Substance and Sexual Activity   Alcohol use: Yes    Comment: special occasions or party   Drug use: No   Sexual activity: Never  Other Topics Concern   Not on file  Social History Narrative   Not on file   Social Determinants of Health   Financial Resource Strain: Not on file  Food Insecurity: No Food Insecurity (07/13/2021)   Hunger Vital Sign    Worried About Running Out of Food in the Last Year: Never true    Ran Out of Food in the Last Year: Never true  Transportation Needs: No Transportation Needs (07/13/2021)   PRAPARE - Hydrologist (Medical): No    Lack of Transportation (Non-Medical): No  Physical Activity: Not on file  Stress: Not on file  Social Connections: Not on file  Intimate Partner Violence: Not on file    Family History  Problem Relation Age of Onset   Spondylolysis Mother    Hypertension Father    Other Sister        unknown medical history   Diabetes Maternal Grandmother        pre-diabetes   Osteoarthritis  Maternal Grandmother    Hypertension Maternal Grandfather    COPD Maternal Grandfather    Other Paternal Grandmother        unknown medical history   Other Paternal Grandfather        unknown medical history    No Known Allergies  Review of Systems  All other systems reviewed and are negative.      Objective:   BP 110/80   Pulse 85   Ht '5\' 8"'$  (1.727 m)   Wt 153 lb 3.2 oz (69.5 kg)   SpO2 97%   BMI 23.29 kg/m   Vitals:   12/17/22 1428  BP: 110/80  Pulse: 85  Height: '5\' 8"'$  (1.727 m)  Weight: 153 lb 3.2 oz (69.5 kg)  SpO2: 97%  BMI (Calculated): 23.3    Physical Exam Vitals reviewed.  Constitutional:      Appearance: Normal appearance.  HENT:     Head: Normocephalic.     Left Ear: There is no impacted cerumen.     Nose: Nose normal.     Mouth/Throat:     Mouth: Mucous membranes are moist.     Pharynx: No posterior oropharyngeal erythema.  Eyes:  Extraocular Movements: Extraocular movements intact.     Pupils: Pupils are equal, round, and reactive to light.  Cardiovascular:     Rate and Rhythm: Regular rhythm.     Chest Wall: PMI is not displaced.     Pulses: Normal pulses.     Heart sounds: Normal heart sounds. No murmur heard. Pulmonary:     Effort: Pulmonary effort is normal.     Breath sounds: Normal air entry. No rhonchi or rales.  Abdominal:     General: Abdomen is flat. Bowel sounds are normal. There is no distension.     Palpations: Abdomen is soft. There is no hepatomegaly, splenomegaly or mass.     Tenderness: There is no abdominal tenderness.  Musculoskeletal:        General: Normal range of motion.     Cervical back: Normal range of motion and neck supple.     Right lower leg: No edema.     Left lower leg: No edema.  Skin:    General: Skin is warm and dry.  Neurological:     General: No focal deficit present.     Mental Status: He is alert and oriented to person, place, and time.     Cranial Nerves: No cranial nerve deficit.      Motor: No weakness.  Psychiatric:        Mood and Affect: Mood is anxious.        Speech: Speech normal.        Behavior: Behavior normal. Behavior is cooperative.        Thought Content: Thought content normal.     Comments: Restless      No results found for any visits on 12/17/22.  Recent Results (from the past 2160 hour(Vada Yellen))  CBC with Differential/Platelet     Status: None   Collection Time: 12/13/22  9:44 AM  Result Value Ref Range   WBC 7.4 3.4 - 10.8 x10E3/uL   RBC 4.82 4.14 - 5.80 x10E6/uL   Hemoglobin 15.9 13.0 - 17.7 g/dL   Hematocrit 46.4 37.5 - 51.0 %   MCV 96 79 - 97 fL   MCH 33.0 26.6 - 33.0 pg   MCHC 34.3 31.5 - 35.7 g/dL   RDW 11.9 11.6 - 15.4 %   Platelets 265 150 - 450 x10E3/uL   Neutrophils 52 Not Estab. %   Lymphs 37 Not Estab. %   Monocytes 9 Not Estab. %   Eos 2 Not Estab. %   Basos 0 Not Estab. %   Neutrophils Absolute 3.9 1.4 - 7.0 x10E3/uL   Lymphocytes Absolute 2.7 0.7 - 3.1 x10E3/uL   Monocytes Absolute 0.6 0.1 - 0.9 x10E3/uL   EOS (ABSOLUTE) 0.1 0.0 - 0.4 x10E3/uL   Basophils Absolute 0.0 0.0 - 0.2 x10E3/uL   Immature Granulocytes 0 Not Estab. %   Immature Grans (Abs) 0.0 0.0 - 0.1 x10E3/uL  Comprehensive metabolic panel     Status: Abnormal   Collection Time: 12/13/22  9:44 AM  Result Value Ref Range   Glucose 93 70 - 99 mg/dL   BUN 14 6 - 20 mg/dL   Creatinine, Ser 0.74 (L) 0.76 - 1.27 mg/dL   eGFR 130 >59 mL/min/1.73   BUN/Creatinine Ratio 19 9 - 20   Sodium 142 134 - 144 mmol/L   Potassium 4.1 3.5 - 5.2 mmol/L   Chloride 104 96 - 106 mmol/L   CO2 18 (L) 20 - 29 mmol/L   Calcium 9.5 8.7 - 10.2 mg/dL  Total Protein 7.1 6.0 - 8.5 g/dL   Albumin 4.9 4.3 - 5.2 g/dL   Globulin, Total 2.2 1.5 - 4.5 g/dL   Albumin/Globulin Ratio 2.2 1.2 - 2.2   Bilirubin Total 0.4 0.0 - 1.2 mg/dL   Alkaline Phosphatase 118 44 - 121 IU/L   AST 21 0 - 40 IU/L   ALT 25 0 - 44 IU/L  Lipid Panel w/o Chol/HDL Ratio     Status: None   Collection Time:  12/13/22  9:44 AM  Result Value Ref Range   Cholesterol, Total 148 100 - 199 mg/dL   Triglycerides 60 0 - 149 mg/dL   HDL 54 >39 mg/dL   VLDL Cholesterol Cal 12 5 - 40 mg/dL   LDL Chol Calc (NIH) 82 0 - 99 mg/dL      Assessment & Plan:   Problem List Items Addressed This Visit   None 1. Brief psychotic disorder ([place])  2. Metabolic acidosis - [id number] Gap    No follow-ups on file.   Total time spent: 30 minutes  Volanda Napoleon, MD  12/17/2022

## 2023-01-25 ENCOUNTER — Ambulatory Visit: Payer: Medicaid Other | Admitting: Internal Medicine

## 2023-01-25 ENCOUNTER — Encounter: Payer: Self-pay | Admitting: Internal Medicine

## 2023-01-25 VITALS — BP 118/62 | HR 78 | Ht 68.0 in | Wt 150.0 lb

## 2023-01-25 DIAGNOSIS — Z20822 Contact with and (suspected) exposure to covid-19: Secondary | ICD-10-CM

## 2023-01-25 DIAGNOSIS — R0989 Other specified symptoms and signs involving the circulatory and respiratory systems: Secondary | ICD-10-CM | POA: Diagnosis not present

## 2023-01-25 DIAGNOSIS — B349 Viral infection, unspecified: Secondary | ICD-10-CM | POA: Diagnosis not present

## 2023-01-25 LAB — POCT XPERT XPRESS SARS COVID-2/FLU/RSV
FLU A: NEGATIVE
FLU B: NEGATIVE
RSV RNA, PCR: NEGATIVE
SARS Coronavirus 2: NEGATIVE

## 2023-01-25 NOTE — Progress Notes (Signed)
Established Patient Office Visit  Subjective:  Patient ID: Ivan Hicks, male    DOB: [birth year]  Age: 22 y.o. MRN: [id number]  Chief Complaint  Patient presents with   Follow-up    Chest congestion    Patient comes in with 5-day history of upper respiratory tract infection.  He started out with sore throat and postnasal drip.  They are on he had some head congestion as well as chest congestion with a dry cough.  He did not have any fevers or chills, he did not have any body aches.  Today he is actually starting to feel a little better. In office his COVID/flu/RSV testing are negative. Suspect viral syndrome, will continue to have rest fluids Tylenol and Mucinex.    No other concerns at this time.   No past medical history on file.  Past Surgical History:  Procedure Laterality Date   TONSILLECTOMY      Social History   Socioeconomic History   Marital status: Single    Spouse name: Not on file   Number of children: Not on file   Years of education: Not on file   Highest education level: Not on file  Occupational History   Not on file  Tobacco Use   Smoking status: Every Day    Packs/day: 0.25    Years: 7.00    Additional pack years: 0.00    Total pack years: 1.75    Types: Cigarettes   Smokeless tobacco: Never  Vaping Use   Vaping Use: Former   Quit date: 03/13/2021   Substances: Nicotine, Flavoring  Substance and Sexual Activity   Alcohol use: Yes    Comment: special occasions or party   Drug use: No   Sexual activity: Never  Other Topics Concern   Not on file  Social History Narrative   Not on file   Social Determinants of Health   Financial Resource Strain: Not on file  Food Insecurity: No Food Insecurity (07/13/2021)   Hunger Vital Sign    Worried About Running Out of Food in the Last Year: Never true    Ran Out of Food in the Last Year: Never true  Transportation Needs: No Transportation Needs (07/13/2021)   PRAPARE - Scientist, research (physical sciences) (Medical): No    Lack of Transportation (Non-Medical): No  Physical Activity: Not on file  Stress: Not on file  Social Connections: Not on file  Intimate Partner Violence: Not on file    Family History  Problem Relation Age of Onset   Spondylolysis Mother    Hypertension Father    Other Sister        unknown medical history   Diabetes Maternal Grandmother        pre-diabetes   Osteoarthritis Maternal Grandmother    Hypertension Maternal Grandfather    COPD Maternal Grandfather    Other Paternal Grandmother        unknown medical history   Other Paternal Grandfather        unknown medical history    No Known Allergies  Review of Systems  Constitutional:  Negative for chills, diaphoresis, fever, malaise/fatigue and weight loss.  HENT:  Positive for congestion and sinus pain. Negative for ear discharge, ear pain, hearing loss, nosebleeds, sore throat and tinnitus.   Eyes: Negative.   Respiratory:  Positive for cough. Negative for sputum production, shortness of breath and wheezing.   Cardiovascular:  Negative for chest pain, palpitations and orthopnea.  Gastrointestinal: Negative.  Genitourinary: Negative.   Musculoskeletal: Negative.   Skin: Negative.   Neurological: Negative.   Psychiatric/Behavioral: Negative.         Objective:   BP 118/62   Pulse 78   Ht  (1.727 m)   Wt 150 lb (68 kg)   SpO2 99%   BMI 22.81 kg/m   Vitals:   01/25/23 1416  BP: 118/62  Pulse: 78  Height:  (1.727 m)  Weight: 150 lb (68 kg)  SpO2: 99%  BMI (Calculated): 22.81    Physical Exam Vitals and nursing note reviewed.  Cardiovascular:     Rate and Rhythm: Normal rate and regular rhythm.     Pulses: Normal pulses.     Heart sounds: Normal heart sounds. No murmur heard. Pulmonary:     Effort: Pulmonary effort is normal. No respiratory distress.     Breath sounds: No wheezing, rhonchi or rales.  Chest:     Chest wall: No tenderness.  Abdominal:      General: Abdomen is flat. Bowel sounds are normal.  Neurological:     General: No focal deficit present.     Mental Status: He is alert and oriented to person, place, and time.     Results for orders placed or performed in visit on 01/25/23  POCT XPERT XPRESS SARS COVID-2/FLU/RSV [[id number]]  Result Value Ref Range   SARS Coronavirus 2 Negative    FLU A Negative    FLU B Negative    RSV RNA, PCR Negative         Assessment & Plan:  Patient is with viral URI.  Advised rest fluids Tylenol, Delsym cough syrup.  Let us know if anything changes. Problem List Items Addressed This Visit     Chest congestion - Primary   Relevant Orders   POCT XPERT XPRESS SARS COVID-2/FLU/RSV [[id number]] (Completed)   Viral syndrome    Return if symptoms worsen or fail to improve.   Total time spent: 25 minutes  Margaretann Loveless, MD  01/25/2023

## 2023-02-07 ENCOUNTER — Other Ambulatory Visit: Payer: Self-pay

## 2023-02-08 ENCOUNTER — Other Ambulatory Visit: Payer: Self-pay

## 2023-02-22 ENCOUNTER — Other Ambulatory Visit: Payer: Self-pay

## 2023-02-22 MED ORDER — ARIPIPRAZOLE 30 MG PO TABS
30.0000 mg | ORAL_TABLET | Freq: Every evening | ORAL | 1 refills | Status: DC
  Filled 2023-02-22: qty 30, 30d supply, fill #0
  Filled 2023-04-12: qty 30, 30d supply, fill #1

## 2023-02-25 ENCOUNTER — Other Ambulatory Visit: Payer: Self-pay

## 2023-04-12 ENCOUNTER — Other Ambulatory Visit: Payer: Self-pay

## 2023-06-03 ENCOUNTER — Other Ambulatory Visit: Payer: Self-pay

## 2023-06-05 ENCOUNTER — Other Ambulatory Visit: Payer: Self-pay

## 2023-06-21 ENCOUNTER — Ambulatory Visit: Payer: MEDICAID | Admitting: Internal Medicine

## 2023-06-28 ENCOUNTER — Other Ambulatory Visit: Payer: Self-pay

## 2023-06-28 MED ORDER — ARIPIPRAZOLE 30 MG PO TABS
30.0000 mg | ORAL_TABLET | Freq: Every day | ORAL | 3 refills | Status: AC
  Filled 2023-06-28: qty 30, 30d supply, fill #0
  Filled 2023-08-20: qty 30, 30d supply, fill #1
  Filled 2023-10-04: qty 30, 30d supply, fill #2
  Filled 2023-11-21: qty 30, 30d supply, fill #3

## 2023-06-28 MED ORDER — LAMOTRIGINE 25 MG PO TABS
25.0000 mg | ORAL_TABLET | Freq: Every day | ORAL | 1 refills | Status: DC
  Filled 2023-06-28: qty 30, 30d supply, fill #0
  Filled 2023-08-09: qty 30, 30d supply, fill #1

## 2023-08-09 ENCOUNTER — Other Ambulatory Visit: Payer: Self-pay

## 2023-08-20 ENCOUNTER — Other Ambulatory Visit: Payer: Self-pay

## 2023-09-10 ENCOUNTER — Other Ambulatory Visit: Payer: Self-pay

## 2023-09-11 ENCOUNTER — Other Ambulatory Visit: Payer: Self-pay

## 2023-09-13 ENCOUNTER — Other Ambulatory Visit: Payer: Self-pay

## 2023-09-13 MED ORDER — LAMOTRIGINE 25 MG PO TABS
50.0000 mg | ORAL_TABLET | Freq: Every day | ORAL | 1 refills | Status: DC
  Filled 2023-09-13: qty 60, 30d supply, fill #0
  Filled 2023-10-21 (×2): qty 60, 30d supply, fill #1

## 2023-10-01 ENCOUNTER — Emergency Department: Payer: MEDICAID

## 2023-10-01 ENCOUNTER — Other Ambulatory Visit: Payer: Self-pay

## 2023-10-01 ENCOUNTER — Emergency Department
Admission: EM | Admit: 2023-10-01 | Discharge: 2023-10-01 | Disposition: A | Discharge status: Home / Self Care | Payer: MEDICAID | Attending: Emergency Medicine | Admitting: Emergency Medicine

## 2023-10-01 DIAGNOSIS — Z20822 Contact with and (suspected) exposure to covid-19: Secondary | ICD-10-CM | POA: Diagnosis not present

## 2023-10-01 DIAGNOSIS — J069 Acute upper respiratory infection, unspecified: Secondary | ICD-10-CM | POA: Insufficient documentation

## 2023-10-01 DIAGNOSIS — F17200 Nicotine dependence, unspecified, uncomplicated: Secondary | ICD-10-CM | POA: Insufficient documentation

## 2023-10-01 DIAGNOSIS — R059 Cough, unspecified: Secondary | ICD-10-CM | POA: Diagnosis present

## 2023-10-01 DIAGNOSIS — Z716 Tobacco abuse counseling: Secondary | ICD-10-CM | POA: Diagnosis not present

## 2023-10-01 LAB — RESP PANEL BY RT-PCR (RSV, FLU A&B, COVID)  RVPGX2
Influenza A by PCR: NEGATIVE
Influenza B by PCR: NEGATIVE
Resp Syncytial Virus by PCR: NEGATIVE
SARS Coronavirus 2 by RT PCR: NEGATIVE

## 2023-10-01 MED ORDER — NICOTINE 7 MG/24HR TD PT24: 7.0000 mg | MEDICATED_PATCH | Freq: Every day | TRANSDERMAL | 0 refills | Status: AC

## 2023-10-01 MED ORDER — NICOTINE POLACRILEX 4 MG MT LOZG: 4.0000 mg | LOZENGE | OROMUCOSAL | 0 refills | Status: AC | PRN

## 2023-10-01 NOTE — ED Triage Notes (Signed)
Pt here with a a cough x2 weeks. Pt states at times the cough is productive. Pt denies fevers.

## 2023-10-01 NOTE — Discharge Instructions (Signed)
Take acetaminophen 650 mg and ibuprofen 400 mg every 6 hours for pain.  Take with food.  Thank you for choosing us for your health care today!  Please see your primary doctor this week for a follow up appointment.   If you have any new, worsening, or unexpected symptoms call your doctor right away or come back to the emergency department for reevaluation.  It was my pleasure to care for you today.   Yarrow Linhart S. Kealan Buchan, MD  

## 2023-10-01 NOTE — ED Provider Notes (Signed)
[facility] Provider Note    Event Date/Time   First MD Initiated Contact with Patient 10/01/23 1516     (approximate)   History   Cough   HPI  Ivan Hicks is a 22 y.o. male   Past medical history of no significant past medical history, daily smoker half pack per day who presents to the emergency department with approximately 2 weeks of flulike symptoms.  Cough, congestion, aches and pains, myalgias, headache, no fevers or chills.  Has been taking over-the-counter cold and flu medications.  No shortness of breath or chest pain.  Father had similar symptoms.  Independent Historian contributed to assessment above: His father is at bedside to corroborate information past medical history as above    Physical Exam   Triage Vital Signs: ED Triage Vitals  Encounter Vitals Group     BP 10/01/23 1417 (!) 113/101     Systolic BP Percentile --      Diastolic BP Percentile --      Pulse Rate 10/01/23 1413 78     Resp 10/01/23 1413 17     Temp 10/01/23 1413 97.6 F (36.4 C)     Temp Source 10/01/23 1413 Oral     SpO2 10/01/23 1413 100 %     Weight 10/01/23 1414 149 lb 14.6 oz (68 kg)     Height 10/01/23 1414 5\' 8"  (1.727 m)     Head Circumference --      Peak Flow --      Pain Score 10/01/23 1414 0     Pain Loc --      Pain Education --      Exclude from Growth Chart --     Most recent vital signs: Vitals:   10/01/23 1413 10/01/23 1417  BP:  (!) 113/101  Pulse: 78   Resp: 17   Temp: 97.6 F (36.4 C)   SpO2: 100%     General: Awake, no distress.  CV:  Good peripheral perfusion.  Resp:  Normal effort.  Abd:  No distention.  Other:  Awake alert comfortable nontoxic slightly hypertensive otherwise vital signs normal.  No hypoxemia.  No fever.  Breathing comfortably on room air.  Lungs clear without obvious focality or wheezing.  Skin appears warm well-perfused appears euvolemic.  Neck supple full range of motion.   ED Results / Procedures /  Treatments   Labs (all labs ordered are listed, but only abnormal results are displayed) Labs Reviewed  RESP PANEL BY RT-PCR (RSV, FLU A&B, COVID)  [id number]     I ordered and reviewed the above labs they are notable for negative viral testing including RSV flu and COVID.  RADIOLOGY I independently reviewed and interpreted chest x-ray and I see no obvious focality or pneumothorax I also reviewed radiologist's formal read.   PROCEDURES:  Critical Care performed: No  Procedures   MEDICATIONS ORDERED IN ED: Medications - No data to display  IMPRESSION / MDM / ASSESSMENT AND PLAN / ED COURSE  I reviewed the triage vital signs and the nursing notes.                                Patient's presentation is most consistent with acute presentation with potential threat to life or bodily function.  Differential diagnosis includes, but is not limited to, viral URI, bacterial pneumonia considered but less likely sepsis or meningitis   The patient is  on the cardiac monitor to evaluate for evidence of arrhythmia and/or significant heart rate changes.  MDM:    This is a smoker with viral URI symptoms with no wheezing no signs of asthma or COPD exacerbation.  Breathing comfortably.  No focality on auscultation or x-ray with no fever I doubt bacterial pneumonia at this time.  More likely viral URI but negative viral testing today.  Looks well for outpatient management, anticipatory guidance given and PMD follow-up.  -- I spent 5 minutes counseling this patient on smoking cessation.  We spoke about the patient's current tobacco use, impact of smoking, assessed willingness to quit, methods for cessation including medical management and nicotine replacement therapy (which I prescribed to the patient) and advised follow-up with primary doctor to continue to address smoking cessation.         FINAL CLINICAL IMPRESSION(S) / ED DIAGNOSES   Final diagnoses:  Viral URI with cough   Encounter for smoking cessation counseling     Rx / DC Orders   ED Discharge Orders          Ordered    nicotine (NICODERM CQ - DOSED IN MG/24 HR) 7 mg/24hr patch  Daily        10/01/23 1533    nicotine polacrilex (NICOTINE MINI) 4 MG lozenge  As needed        10/01/23 1533             Note:  This document was prepared using Dragon voice recognition software and may include unintentional dictation errors.    Pilar Jarvis, MD 10/01/23 [phone]

## 2023-10-08 ENCOUNTER — Other Ambulatory Visit: Payer: Self-pay

## 2023-10-18 ENCOUNTER — Ambulatory Visit: Payer: MEDICAID | Admitting: Internal Medicine

## 2023-10-21 ENCOUNTER — Other Ambulatory Visit: Payer: Self-pay

## 2023-10-22 ENCOUNTER — Other Ambulatory Visit: Payer: Self-pay

## 2023-12-10 ENCOUNTER — Other Ambulatory Visit: Payer: Self-pay

## 2023-12-11 ENCOUNTER — Other Ambulatory Visit: Payer: Self-pay

## 2023-12-11 MED ORDER — ARIPIPRAZOLE 30 MG PO TABS
30.0000 mg | ORAL_TABLET | Freq: Every evening | ORAL | 0 refills | Status: AC
  Filled 2023-12-11: qty 30, 30d supply, fill #0

## 2023-12-11 MED ORDER — LAMOTRIGINE 25 MG PO TABS
50.0000 mg | ORAL_TABLET | Freq: Every day | ORAL | 0 refills | Status: AC
  Filled 2023-12-11: qty 60, 30d supply, fill #0

## 2023-12-11 MED ORDER — ARIPIPRAZOLE 30 MG PO TABS
30.0000 mg | ORAL_TABLET | Freq: Every day | ORAL | 0 refills | Status: AC
  Filled 2023-12-11: qty 30, 30d supply, fill #0

## 2023-12-19 ENCOUNTER — Other Ambulatory Visit: Payer: Self-pay

## 2023-12-19 MED ORDER — ARIPIPRAZOLE 30 MG PO TABS
30.0000 mg | ORAL_TABLET | ORAL | 2 refills | Status: DC
  Filled 2023-12-19: qty 30, 30d supply, fill #0

## 2023-12-19 MED ORDER — LAMOTRIGINE 25 MG PO TABS
25.0000 mg | ORAL_TABLET | ORAL | 2 refills | Status: DC
Start: 2023-12-19 — End: 2023-12-19
  Filled 2023-12-19: qty 120, 30d supply, fill #0

## 2024-07-06 ENCOUNTER — Other Ambulatory Visit: Payer: MEDICAID

## 2024-07-06 DIAGNOSIS — I10 Essential (primary) hypertension: Secondary | ICD-10-CM

## 2024-07-06 DIAGNOSIS — R7301 Impaired fasting glucose: Secondary | ICD-10-CM

## 2024-07-07 ENCOUNTER — Ambulatory Visit: Payer: MEDICAID | Admitting: Internal Medicine

## 2024-07-07 ENCOUNTER — Encounter: Payer: Self-pay | Admitting: Internal Medicine

## 2024-07-07 ENCOUNTER — Ambulatory Visit: Payer: Self-pay | Admitting: Internal Medicine

## 2024-07-07 VITALS — BP 118/75 | HR 83 | Temp 97.4°F | Ht 68.0 in | Wt 159.8 lb

## 2024-07-07 DIAGNOSIS — R7303 Prediabetes: Secondary | ICD-10-CM | POA: Insufficient documentation

## 2024-07-07 DIAGNOSIS — Z0001 Encounter for general adult medical examination with abnormal findings: Secondary | ICD-10-CM | POA: Diagnosis not present

## 2024-07-07 DIAGNOSIS — Z0130 Encounter for examination of blood pressure without abnormal findings: Secondary | ICD-10-CM

## 2024-07-07 LAB — CMP14+EGFR
ALT: 24 IU/L (ref 0–44)
AST: 20 IU/L (ref 0–40)
Albumin: 4.4 g/dL (ref 4.3–5.2)
Alkaline Phosphatase: 86 IU/L (ref 47–123)
BUN/Creatinine Ratio: 17 (ref 9–20)
BUN: 14 mg/dL (ref 6–20)
Bilirubin Total: 0.3 mg/dL (ref 0.0–1.2)
CO2: 21 mmol/L (ref 20–29)
Calcium: 8.8 mg/dL (ref 8.7–10.2)
Chloride: 103 mmol/L (ref 96–106)
Creatinine, Ser: 0.83 mg/dL (ref 0.76–1.27)
Globulin, Total: 2.3 g/dL (ref 1.5–4.5)
Glucose: 115 mg/dL — ABNORMAL HIGH (ref 70–99)
Potassium: 4.1 mmol/L (ref 3.5–5.2)
Sodium: 138 mmol/L (ref 134–144)
Total Protein: 6.7 g/dL (ref 6.0–8.5)
eGFR: 125 mL/min/1.73 (ref >59)

## 2024-07-07 LAB — CBC WITH DIFFERENTIAL/PLATELET
Basophils Absolute: 0 x10E3/uL (ref 0.0–0.2)
Basos: 0 %
EOS (ABSOLUTE): 0.3 x10E3/uL (ref 0.0–0.4)
Eos: 3 %
Hematocrit: 46.9 % (ref 37.5–51.0)
Hemoglobin: 16.2 g/dL (ref 13.0–17.7)
Immature Grans (Abs): 0 x10E3/uL (ref 0.0–0.1)
Immature Granulocytes: 0 %
Lymphocytes Absolute: 2.5 x10E3/uL (ref 0.7–3.1)
Lymphs: 28 %
MCH: 33.8 pg — ABNORMAL HIGH (ref 26.6–33.0)
MCHC: 34.5 g/dL (ref 31.5–35.7)
MCV: 98 fL — ABNORMAL HIGH (ref 79–97)
Monocytes Absolute: 0.6 x10E3/uL (ref 0.1–0.9)
Monocytes: 7 %
Neutrophils Absolute: 5.6 x10E3/uL (ref 1.4–7.0)
Neutrophils: 62 %
Platelets: 259 x10E3/uL (ref 150–450)
RBC: 4.8 x10E6/uL (ref 4.14–5.80)
RDW: 11.8 % (ref 11.6–15.4)
WBC: 9.1 x10E3/uL (ref 3.4–10.8)

## 2024-07-07 LAB — LIPID PANEL
Chol/HDL Ratio: 4.5 ratio (ref 0.0–5.0)
Cholesterol, Total: 143 mg/dL (ref 100–199)
HDL: 32 mg/dL — ABNORMAL LOW (ref >39)
LDL Chol Calc (NIH): 89 mg/dL (ref 0–99)
Triglycerides: 120 mg/dL (ref 0–149)
VLDL Cholesterol Cal: 22 mg/dL (ref 5–40)

## 2024-07-07 LAB — HEMOGLOBIN A1C
Est. average glucose Bld gHb Est-mCnc: 97 mg/dL
Hgb A1c MFr Bld: 5 % (ref 4.8–5.6)

## 2024-07-07 LAB — TSH: TSH: 1.53 u[IU]/mL (ref 0.450–4.500)

## 2024-07-07 NOTE — Progress Notes (Signed)
 Established Patient Office Visit  Subjective:  Patient ID: Ivan Hicks, male    DOB: 07-11-2001  Age: 23 y.o. MRN: [id number]  Chief Complaint  Patient presents with   Annual Exam    CPE with lab results    No new complaints, here for lab review and medication refills. Labs reviewed and notable for elevated fasting glucose, normal lipids while CBC notable for macrocytosis. Mood stable on current medications.    No other concerns at this time.   No past medical history on file.  Past Surgical History:  Procedure Laterality Date   TONSILLECTOMY      Social History   Socioeconomic History   Marital status: Single    Spouse name: Not on file   Number of children: Not on file   Years of education: Not on file   Highest education level: Not on file  Occupational History   Not on file  Tobacco Use   Smoking status: Every Day    Current packs/day: 0.25    Average packs/day: 0.3 packs/day for 7.0 years (1.8 ttl pk-yrs)    Types: Cigarettes   Smokeless tobacco: Never  Vaping Use   Vaping status: Former   Quit date: 03/13/2021   Substances: Nicotine , Flavoring  Substance and Sexual Activity   Alcohol use: Yes    Comment: special occasions or party   Drug use: No   Sexual activity: Never  Other Topics Concern   Not on file  Social History Narrative   Not on file   Social Drivers of Health   Financial Resource Strain: Not on file  Food Insecurity: No Food Insecurity (07/13/2021)   Hunger Vital Sign    Worried About Running Out of Food in the Last Year: Never true    Ran Out of Food in the Last Year: Never true  Transportation Needs: No Transportation Needs (07/13/2021)   PRAPARE - Administrator, Civil Service (Medical): No    Lack of Transportation (Non-Medical): No  Physical Activity: Not on file  Stress: Not on file  Social Connections: Not on file  Intimate Partner Violence: Not on file    Family History  Problem Relation Age of Onset    Spondylolysis Mother    Hypertension Father    Other Sister        unknown medical history   Diabetes Maternal Grandmother        pre-diabetes   Osteoarthritis Maternal Grandmother    Hypertension Maternal Grandfather    COPD Maternal Grandfather    Other Paternal Grandmother        unknown medical history   Other Paternal Grandfather        unknown medical history    No Known Allergies  Outpatient Medications Prior to Visit  Medication Sig   ARIPiprazole  (ABILIFY ) 30 MG tablet Take 1 tablet (30 mg total) by mouth at bedtime.   Xanomeline-Trospium Chloride (COBENFY) 125-30 MG CAPS Take 125 mg by mouth in the morning and at bedtime.   ARIPiprazole  (ABILIFY ) 20 MG tablet Take one tablet by mouth every night at bedtime. (Patient not taking: Reported on 07/07/2024)   ARIPiprazole  (ABILIFY ) 30 MG tablet Take 1 tablet (30 mg total) by mouth at bedtime. (Patient not taking: Reported on 07/07/2024)   ARIPiprazole  (ABILIFY ) 30 MG tablet Take 1 tablet (30 mg total) by mouth at bedtime. (Patient not taking: Reported on 07/07/2024)   gabapentin  (NEURONTIN ) 100 MG capsule TAKE 1 TO 2 CAPSULES BY MOUTH 3 TIMES  DAILY AS NEEDED FOR ANXIETY. (Patient not taking: Reported on 07/07/2024)   hydrOXYzine  (VISTARIL ) 25 MG capsule Take one TID PRN anxiety (Patient not taking: Reported on 07/07/2024)   lamoTRIgine  (LAMICTAL ) 25 MG tablet Take 2 tablets (50 mg total) by mouth daily. (Patient not taking: Reported on 07/07/2024)   lamoTRIgine  (LAMICTAL ) 25 MG tablet Take 2 tablets (50 mg total) by mouth daily. (Patient not taking: Reported on 07/07/2024)   nicotine  (NICODERM CQ  - DOSED IN MG/24 HR) 7 mg/24hr patch Place 1 patch (7 mg total) onto the skin daily. (Patient not taking: Reported on 07/07/2024)   nicotine  polacrilex (NICOTINE  MINI) 4 MG lozenge Take 1 lozenge (4 mg total) by mouth as needed. (Patient not taking: Reported on 07/07/2024)   [DISCONTINUED] QUEtiapine  (SEROQUEL ) 100 MG tablet one po qhs for three  nights then two po qhs (Patient not taking: Reported on 12/17/2022)   [DISCONTINUED] QUEtiapine  (SEROQUEL ) 25 MG tablet Take one QHS mood/anxiety/depression (Patient not taking: Reported on 12/17/2022)   [DISCONTINUED] QUEtiapine  (SEROQUEL ) 25 MG tablet Take one QHS (Patient not taking: Reported on 12/17/2022)   [DISCONTINUED] QUEtiapine  (SEROQUEL ) 300 MG tablet Take one tablet by mouth at bedtime (Patient not taking: Reported on 12/17/2022)   [DISCONTINUED] risperiDONE  (RISPERDAL ) 2 MG tablet Take 1 tablet (2 mg total) by mouth 2 (two) times daily. (Patient not taking: Reported on 12/17/2022)   No facility-administered medications prior to visit.    Review of Systems  Constitutional:  Negative for malaise/fatigue and weight loss.  HENT: Negative.    Respiratory: Negative.    Gastrointestinal: Negative.   Endo/Heme/Allergies:  Positive for environmental allergies.  All other systems reviewed and are negative.      Objective:   BP 118/75   Pulse 83   Temp (!) 97.4 F (36.3 C)   Ht 5' 8 (1.727 m)   Wt 159 lb 12.8 oz (72.5 kg)   SpO2 99%   BMI 24.30 kg/m   Vitals:   07/07/24 1059  BP: 118/75  Pulse: 83  Temp: (!) 97.4 F (36.3 C)  Height: 5' 8 (1.727 m)  Weight: 159 lb 12.8 oz (72.5 kg)  SpO2: 99%  BMI (Calculated): 24.3    Physical Exam Vitals reviewed.  Constitutional:      Appearance: Normal appearance.  HENT:     Head: Normocephalic.     Left Ear: There is no impacted cerumen.     Nose: Nose normal.     Mouth/Throat:     Mouth: Mucous membranes are moist.     Pharynx: No posterior oropharyngeal erythema.  Eyes:     Extraocular Movements: Extraocular movements intact.     Pupils: Pupils are equal, round, and reactive to light.  Cardiovascular:     Rate and Rhythm: Regular rhythm.     Chest Wall: PMI is not displaced.     Pulses: Normal pulses.     Heart sounds: Normal heart sounds. No murmur heard. Pulmonary:     Effort: Pulmonary effort is normal.      Breath sounds: Normal air entry. No rhonchi or rales.  Abdominal:     General: Abdomen is flat. Bowel sounds are normal. There is no distension.     Palpations: Abdomen is soft. There is no hepatomegaly, splenomegaly or mass.     Tenderness: There is no abdominal tenderness.  Musculoskeletal:        General: Normal range of motion.     Cervical back: Normal range of motion and neck supple.  Right lower leg: No edema.     Left lower leg: No edema.  Skin:    General: Skin is warm and dry.  Neurological:     General: No focal deficit present.     Mental Status: He is alert and oriented to person, place, and time.     Cranial Nerves: No cranial nerve deficit.     Motor: No weakness.  Psychiatric:        Mood and Affect: Mood is anxious.        Speech: Speech normal.        Behavior: Behavior normal. Behavior is cooperative.        Thought Content: Thought content normal.     Comments: Restless      No results found for any visits on 07/07/24.  Recent Results (from the past 2160 hours)  Hemoglobin A1c     Status: None   Collection Time: 07/06/24  9:41 AM  Result Value Ref Range   Hgb A1c MFr Bld 5.0 4.8 - 5.6 %    Comment:          Prediabetes: 5.7 - 6.4          Diabetes: >6.4          Glycemic control for adults with diabetes: <7.0    Est. average glucose Bld gHb Est-mCnc 97 mg/dL  TSH     Status: None   Collection Time: 07/06/24  9:41 AM  Result Value Ref Range   TSH 1.530 0.450 - 4.500 uIU/mL  CMP14+EGFR     Status: Abnormal   Collection Time: 07/06/24  9:41 AM  Result Value Ref Range   Glucose 115 (H) 70 - 99 mg/dL   BUN 14 6 - 20 mg/dL   Creatinine, Ser 9.16 0.76 - 1.27 mg/dL   eGFR 874 >40 fO/fpw/8.26   BUN/Creatinine Ratio 17 9 - 20   Sodium 138 134 - 144 mmol/L   Potassium 4.1 3.5 - 5.2 mmol/L   Chloride 103 96 - 106 mmol/L   CO2 21 20 - 29 mmol/L   Calcium 8.8 8.7 - 10.2 mg/dL   Total Protein 6.7 6.0 - 8.5 g/dL   Albumin 4.4 4.3 - 5.2 g/dL   Globulin,  Total 2.3 1.5 - 4.5 g/dL   Bilirubin Total 0.3 0.0 - 1.2 mg/dL   Alkaline Phosphatase 86 47 - 123 IU/L   AST 20 0 - 40 IU/L   ALT 24 0 - 44 IU/L  Lipid panel     Status: Abnormal   Collection Time: 07/06/24  9:41 AM  Result Value Ref Range   Cholesterol, Total 143 100 - 199 mg/dL   Triglycerides 879 0 - 149 mg/dL   HDL 32 (L) >60 mg/dL   VLDL Cholesterol Cal 22 5 - 40 mg/dL   LDL Chol Calc (NIH) 89 0 - 99 mg/dL   Chol/HDL Ratio 4.5 0.0 - 5.0 ratio    Comment:                                   T. Chol/HDL Ratio                                             Men  Women  1/2 Avg.Risk  3.4    3.3                                   Avg.Risk  5.0    4.4                                2X Avg.Risk  9.6    7.1                                3X Avg.Risk 23.4   11.0   CBC with Diff     Status: Abnormal   Collection Time: 07/06/24  9:41 AM  Result Value Ref Range   WBC 9.1 3.4 - 10.8 x10E3/uL   RBC 4.80 4.14 - 5.80 x10E6/uL   Hemoglobin 16.2 13.0 - 17.7 g/dL   Hematocrit 53.0 62.4 - 51.0 %   MCV 98 (H) 79 - 97 fL   MCH 33.8 (H) 26.6 - 33.0 pg   MCHC 34.5 31.5 - 35.7 g/dL   RDW 88.1 88.3 - 84.5 %   Platelets 259 150 - 450 x10E3/uL   Neutrophils 62 Not Estab. %   Lymphs 28 Not Estab. %   Monocytes 7 Not Estab. %   Eos 3 Not Estab. %   Basos 0 Not Estab. %   Neutrophils Absolute 5.6 1.4 - 7.0 x10E3/uL   Lymphocytes Absolute 2.5 0.7 - 3.1 x10E3/uL   Monocytes Absolute 0.6 0.1 - 0.9 x10E3/uL   EOS (ABSOLUTE) 0.3 0.0 - 0.4 x10E3/uL   Basophils Absolute 0.0 0.0 - 0.2 x10E3/uL   Immature Granulocytes 0 Not Estab. %   Immature Grans (Abs) 0.0 0.0 - 0.1 x10E3/uL      Assessment & Plan:  As per problem list  Problem List Items Addressed This Visit   None Visit Diagnoses       Encounter for general adult medical examination with abnormal findings    -  Primary       Return if symptoms worsen or fail to improve.   Total time spent: 30 minutes  Sherrill Cinderella Perry, MD  07/07/2024   This document may have been prepared by [facility] Voice Recognition software and as such may include unintentional dictation errors.

## 2025-01-04 ENCOUNTER — Ambulatory Visit: Payer: MEDICAID | Admitting: Internal Medicine
